# Patient Record
Sex: Female | Born: 1980
Health system: Southern US, Community
[De-identification: ages and names within clinical notes are randomized; demographics above are authoritative.]

## PROBLEM LIST (undated history)

## (undated) DIAGNOSIS — R87629 Unspecified abnormal cytological findings in specimens from vagina: Secondary | ICD-10-CM

## (undated) DIAGNOSIS — K219 Gastro-esophageal reflux disease without esophagitis: Secondary | ICD-10-CM

## (undated) DIAGNOSIS — L680 Hirsutism: Secondary | ICD-10-CM

## (undated) DIAGNOSIS — D649 Anemia, unspecified: Secondary | ICD-10-CM

## (undated) DIAGNOSIS — F419 Anxiety disorder, unspecified: Secondary | ICD-10-CM

## (undated) HISTORY — DX: Hirsutism: L68.0

## (undated) HISTORY — DX: Gastro-esophageal reflux disease without esophagitis: K21.9

## (undated) HISTORY — PX: COLPOSCOPY: SHX161

## (undated) HISTORY — DX: Unspecified abnormal cytological findings in specimens from vagina: R87.629

---

## 2006-08-08 HISTORY — PX: COLONOSCOPY: SHX174

## 2014-10-01 ENCOUNTER — Other Ambulatory Visit (INDEPENDENT_AMBULATORY_CARE_PROVIDER_SITE_OTHER): Payer: Self-pay | Admitting: Surgery

## 2014-10-01 NOTE — H&P (Signed)
Debbie Lloyd. Debbie Lloyd 10/01/2014 8:56 AM Location: [facility] Patient #: [id number] DOB: [birth year] Undefined / Language: [place] / Race: Undefined Female History of Present Illness Debbie Sportsman MD; 10/01/2014 9:34 AM) Patient words: hem.  The patient is a 34 year old female who presents with hemorrhoids. Patient comes here over concern of hemorrhoids. Pleasant active female. Avid long distance runner. Developed pain and swelling perianally concern for hemorrhoid flare years ago. Would intermittently prolapse. Saw gastroenterology last summer. No major abnormalities noted. Patient notes that it is out all the time now. Causes itching and irritation. She denies any severe pain. Has a distant history of a short period of rectal bleeding. Had normal colonoscopy in [place] at age 68. Normally has a bowel movement twice a day. Other Problems Debbie Bickers, LPN; 0/45/4098 1:19 AM) Back Pain  Past Surgical History Debbie Bickers, LPN; 1/47/8295 6:21 AM) Colon Polyp Removal - Colonoscopy Oral Surgery  Diagnostic Studies History Debbie Bickers, LPN; 10/13/6576 4:69 AM) Colonoscopy >10 years ago Pap Smear 1-5 years ago  Allergies Debbie Bickers, LPN; 02/04/5283 1:32 AM) No Known Drug Allergies 10/01/2014  Medication History Debbie Bickers, LPN; 4/40/1027 2:53 AM) Blain Pais 1/35E (21) (1-35MG -MCG Tablet, Oral) Active. Multivitamins (Oral) Active. Fish Oil Active. Fish Oil (  Capsule, Oral) Active. Iron Supplement (325 (65 Fe)MG Tablet, Oral) Active. Medications Reconciled  Social History Debbie Bickers, LPN; 6/64/4034 7:42 AM) Alcohol use Occasional alcohol use. Caffeine use Coffee, Tea. No drug use Tobacco use Never smoker.  Family History Debbie Bickers, LPN; 5/95/6387 5:64 AM) Diabetes Mellitus Father. Heart disease in female family member before age 37 Hypertension Mother.  Pregnancy / Birth History Debbie Bickers, LPN; 3/32/9518 8:41  AM) Age at menarche 11 years. Contraceptive History Oral contraceptives. Gravida 0 Regular periods     Review of Systems Debbie Lloyd [facility] LPN; 6/60/6301 6:01 AM) General Not Present- Appetite Loss, Chills, Fatigue, Fever, Night Sweats, Weight Gain and Weight Loss. Skin Not Present- Change in Wart/Mole, Dryness, Hives, Jaundice, New Lesions, Non-Healing Wounds, Rash and Ulcer. HEENT Not Present- Earache, Hearing Loss, Hoarseness, Nose Bleed, Oral Ulcers, Ringing in the Ears, Seasonal Allergies, Sinus Pain, Sore Throat, Visual Disturbances, Wears glasses/contact lenses and Yellow Eyes. Breast Not Present- Breast Mass, Breast Pain, Nipple Discharge and Skin Changes. Cardiovascular Not Present- Chest Pain, Difficulty Breathing Lying Down, Leg Cramps, Palpitations, Rapid Heart Rate, Shortness of Breath and Swelling of Extremities. Gastrointestinal Present- Hemorrhoids. Not Present- Abdominal Pain, Bloating, Bloody Stool, Change in Bowel Habits, Chronic diarrhea, Constipation, Difficulty Swallowing, Excessive gas, Gets full quickly at meals, Indigestion, Nausea, Rectal Pain and Vomiting. Female Genitourinary Not Present- Frequency, Nocturia, Painful Urination, Pelvic Pain and Urgency. Musculoskeletal Present- Back Pain. Not Present- Joint Pain, Joint Stiffness, Muscle Pain, Muscle Weakness and Swelling of Extremities. Neurological Not Present- Decreased Memory, Fainting, Headaches, Numbness, Seizures, Tingling, Tremor, Trouble walking and Weakness. Psychiatric Not Present- Anxiety, Bipolar, Change in Sleep Pattern, Depression, Fearful and Frequent crying. Endocrine Not Present- Cold Intolerance, Excessive Hunger, Hair Changes, Heat Intolerance, Hot flashes and New Diabetes. Hematology Not Present- Easy Bruising, Excessive bleeding, Gland problems, HIV and Persistent Infections.  Vitals Debbie Lloyd Dockery LPN; 0/93/2355 7:32 AM) 10/01/2014 8:59 AM Weight: 113.13 lb Height: 62in Body Surface  Area: 1.5 m Body Mass Index: 20.69 kg/m Temp.: 97.53F  Pulse: 63 (Regular)  Resp.: 14 (Unlabored)  BP: 108/68 (Sitting, Left Arm, Standard)     Physical Exam Debbie Sportsman MD; 10/01/2014 9:16 AM)  General Mental Status-Alert. General Appearance-Not in acute distress, Not Sickly. Orientation-Oriented X3. Hydration-Well  hydrated. Voice-Normal.  Integumentary Global Assessment Upon inspection and palpation of skin surfaces of the - Axillae: non-tender, no inflammation or ulceration, no drainage. and Distribution of scalp and body hair is normal. General Characteristics Temperature - normal warmth is noted.  Head and Neck Head-normocephalic, atraumatic with no lesions or palpable masses. Face Global Assessment - atraumatic, no absence of expression. Neck Global Assessment - no abnormal movements, no bruit auscultated on the right, no bruit auscultated on the left, no decreased range of motion, non-tender. Trachea-midline. Thyroid Gland Characteristics - non-tender.  Eye Eyeball - Left-Extraocular movements intact, No Nystagmus. Eyeball - Right-Extraocular movements intact, No Nystagmus. Cornea - Left-No Hazy. Cornea - Right-No Hazy. Sclera/Conjunctiva - Left-No scleral icterus, No Discharge. Sclera/Conjunctiva - Right-No scleral icterus, No Discharge. Pupil - Left-Direct reaction to light normal. Pupil - Right-Direct reaction to light normal.  ENMT Ears Pinna - Left - no drainage observed, no generalized tenderness observed. Right - no drainage observed, no generalized tenderness observed. Nose and Sinuses External Inspection of the Nose - no destructive lesion observed. Inspection of the nares - Left - quiet respiration. Right - quiet respiration. Mouth and Throat Lips - Upper Lip - no fissures observed, no pallor noted. Lower Lip - no fissures observed, no pallor noted. Nasopharynx - no discharge present. Oral Cavity/Oropharynx  - Tongue - no dryness observed. Oral Mucosa - no cyanosis observed. Hypopharynx - no evidence of airway distress observed.  Chest and Lung Exam Inspection Movements - Normal and Symmetrical. Accessory muscles - No use of accessory muscles in breathing. Palpation Palpation of the chest reveals - Non-tender. Auscultation Breath sounds - Normal and Clear.  Cardiovascular Auscultation Rhythm - Regular. Murmurs & Other Heart Sounds - Auscultation of the heart reveals - No Murmurs and No Systolic Clicks.  Abdomen Inspection Inspection of the abdomen reveals - No Visible peristalsis and No Abnormal pulsations. Umbilicus - No Bleeding, No Urine drainage. Palpation/Percussion Palpation and Percussion of the abdomen reveal - Soft, Non Tender, No Rebound tenderness, No Rigidity (guarding) and No Cutaneous hyperesthesia.  Female Genitourinary Sexual Maturity Tanner 5 - Adult hair pattern. Note: Normal external female genitalia. No no sores or lesions. No condyloma. No inflamed cysts. No vaginal bleeding nor discharge   Rectal Note: Perianal skin clean. No pilonidal disease. No pruritis. Obvious prolapsed internal hemorrhoid somewhat pedunculated. LEFT lateral pile. Normal sphincter tone. Tolerated digital and anoscopic exam. No fissure. No fistula. No proctitis. Mildly irritated RIGHT posterior and anterior internal hemorrhoid piles but not severe. Grade 1.   Peripheral Vascular Upper Extremity Inspection - Left - No Cyanotic nailbeds, Not Ischemic. Right - No Cyanotic nailbeds, Not Ischemic.  Neurologic Neurologic evaluation reveals -normal attention span and ability to concentrate, able to name objects and repeat phrases. Appropriate fund of knowledge , normal sensation and normal coordination. Mental Status Affect - not angry, not paranoid. Cranial Nerves-Normal Bilaterally. Gait-Normal.  Neuropsychiatric Mental status exam performed with findings of-able to articulate well  with normal speech/language, rate, volume and coherence, thought content normal with ability to perform basic computations and apply abstract reasoning and no evidence of hallucinations, delusions, obsessions or homicidal/suicidal ideation.  Musculoskeletal Global Assessment Spine, Ribs and Pelvis - no instability, subluxation or laxity. Right Upper Extremity - no instability, subluxation or laxity.  Lymphatic Head & Neck  General Head & Neck Lymphatics: Bilateral - Description - No Localized lymphadenopathy. Axillary  General Axillary Region: Bilateral - Description - No Localized lymphadenopathy. Femoral & Inguinal  Generalized Femoral & Inguinal Lymphatics: Left -  Description - No Localized lymphadenopathy. Right - Description - No Localized lymphadenopathy.   Results Debbie Sportsman MD; 10/01/2014 9:20 AM) Procedures  Name Value Date Hemorrhoids Procedure Anal exam: prolapse Internal exam: Internal Hemorroids ( non-bleeding) Other: Obvious LEFT lateral prolapsed hemorrhoid. Please see rest of rectal examination. Mildly irritated RIGHT sided internal piles.  Performed: 10/01/2014 9:19 AM    Assessment & Plan Debbie Sportsman MD; 10/01/2014 9:20 AM)  PROLAPSED INTERNAL HEMORRHOIDS, GRADE 4 (455.2  K64.8) Impression: Obvious LEFT lateral prolapsed hemorrhoid not reducible. I think this would benefit from removal. 2 sensitive to resect in office. We'll plan outpatient surgery. Possible hemorrhoid ligation of the piles if an issue but we tried to be overly aggressive. She is interested in surgery:  The anatomy & physiology of the anorectal region was discussed. The pathophysiology of hemorrhoids and differential diagnosis was discussed. Natural history progression was discussed. I stressed the importance of a bowel regimen to have daily soft bowel movements to minimize progression of disease. Goal of one BM / day ideal. Use of wet wipes, warm baths, avoiding straining,  etc were emphasized.  Educational handouts further explaining the pathology, treatment options, and bowel regimen were given as well. The patient expressed understanding.  Current Plans Schedule for Surgery The anatomy & physiology of the anorectal region was discussed. The pathophysiology of hemorrhoids and differential diagnosis was discussed. Natural history risks without surgery was discussed. I stressed the importance of a bowel regimen to have daily soft bowel movements to minimize progression of disease. Interventions such as sclerotherapy & banding were discussed.  The patient's symptoms are not adequately controlled by medicines and other non-operative treatments. I feel the risks & problems of no surgery outweigh the operative risks; therefore, I recommended surgery to treat the hemorrhoids by ligation, pexy, and possible resection.  Risks such as bleeding, infection, urinary difficulties, need for further treatment, heart attack, death, and other risks were discussed. I noted a good likelihood this will help address the problem. Goals of post-operative recovery were discussed as well. Possibility that this will not correct all symptoms was explained. Post-operative pain, bleeding, constipation, and other problems after surgery were discussed. We will work to minimize complications. Educational handouts further explaining the pathology, treatment options, and bowel regimen were given as well. Questions were answered. The patient expresses understanding & wishes to proceed with surgery. Written instructions provided Pt Education - [facility] Hemorrhoids (English Tomer) Pt Education - [facility] Rectal Surgery HCI (Jaqlyn Gruenhagen): discussed with patient and provided information. Pt Education - [facility] Good Bowel Health (Honi Name) Pt Education - [facility] Pelvic Floor Exercises (Kegels) and Dysfunction HCI (Demarie Uhlig) ANOSCOPY, DIAGNOSTIC (60454)  Debbie Lloyd, M.D., F.A.C.S. Gastrointestinal and Minimally Invasive [facility] [address] [place], [place] [address] [phone] Main / Paging

## 2015-02-06 HISTORY — PX: HEMORROIDECTOMY: SUR656

## 2015-03-09 ENCOUNTER — Other Ambulatory Visit: Payer: Self-pay | Admitting: Physician Assistant

## 2015-03-09 ENCOUNTER — Ambulatory Visit
Admission: RE | Admit: 2015-03-09 | Discharge: 2015-03-09 | Disposition: A | Discharge status: Home / Self Care | Payer: Self-pay | Source: Ambulatory Visit | Attending: Physician Assistant | Admitting: Physician Assistant

## 2015-03-09 DIAGNOSIS — I2699 Other pulmonary embolism without acute cor pulmonale: Secondary | ICD-10-CM

## 2015-03-09 MED ORDER — IOPAMIDOL (ISOVUE-370) INJECTION 76%
80.0000 mL | Freq: Once | INTRAVENOUS | Status: AC | PRN
  Administered 2015-03-09: 80 mL via INTRAVENOUS

## 2015-03-10 ENCOUNTER — Other Ambulatory Visit: Payer: Self-pay | Admitting: Physician Assistant

## 2015-11-11 DIAGNOSIS — Z3169 Encounter for other general counseling and advice on procreation: Secondary | ICD-10-CM | POA: Diagnosis not present

## 2015-12-25 DIAGNOSIS — M9904 Segmental and somatic dysfunction of sacral region: Secondary | ICD-10-CM | POA: Diagnosis not present

## 2015-12-25 DIAGNOSIS — M9903 Segmental and somatic dysfunction of lumbar region: Secondary | ICD-10-CM | POA: Diagnosis not present

## 2015-12-25 DIAGNOSIS — M791 Myalgia: Secondary | ICD-10-CM | POA: Diagnosis not present

## 2015-12-25 DIAGNOSIS — M9906 Segmental and somatic dysfunction of lower extremity: Secondary | ICD-10-CM | POA: Diagnosis not present

## 2016-02-02 DIAGNOSIS — L659 Nonscarring hair loss, unspecified: Secondary | ICD-10-CM | POA: Diagnosis not present

## 2016-02-02 DIAGNOSIS — D649 Anemia, unspecified: Secondary | ICD-10-CM | POA: Diagnosis not present

## 2016-02-25 DIAGNOSIS — H5213 Myopia, bilateral: Secondary | ICD-10-CM | POA: Diagnosis not present

## 2016-03-07 DIAGNOSIS — R87612 Low grade squamous intraepithelial lesion on cytologic smear of cervix (LGSIL): Secondary | ICD-10-CM | POA: Diagnosis not present

## 2016-03-07 DIAGNOSIS — Z681 Body mass index (BMI) 19 or less, adult: Secondary | ICD-10-CM | POA: Diagnosis not present

## 2016-03-11 DIAGNOSIS — F4322 Adjustment disorder with anxiety: Secondary | ICD-10-CM | POA: Diagnosis not present

## 2016-03-22 DIAGNOSIS — F4323 Adjustment disorder with mixed anxiety and depressed mood: Secondary | ICD-10-CM | POA: Diagnosis not present

## 2016-03-31 DIAGNOSIS — F4323 Adjustment disorder with mixed anxiety and depressed mood: Secondary | ICD-10-CM | POA: Diagnosis not present

## 2016-04-13 DIAGNOSIS — F4323 Adjustment disorder with mixed anxiety and depressed mood: Secondary | ICD-10-CM | POA: Diagnosis not present

## 2016-04-28 DIAGNOSIS — F4323 Adjustment disorder with mixed anxiety and depressed mood: Secondary | ICD-10-CM | POA: Diagnosis not present

## 2016-05-02 DIAGNOSIS — F411 Generalized anxiety disorder: Secondary | ICD-10-CM | POA: Diagnosis not present

## 2016-05-07 DIAGNOSIS — Z23 Encounter for immunization: Secondary | ICD-10-CM | POA: Diagnosis not present

## 2016-05-19 DIAGNOSIS — F4322 Adjustment disorder with anxiety: Secondary | ICD-10-CM | POA: Diagnosis not present

## 2016-05-19 DIAGNOSIS — F4323 Adjustment disorder with mixed anxiety and depressed mood: Secondary | ICD-10-CM | POA: Diagnosis not present

## 2016-06-02 DIAGNOSIS — F4323 Adjustment disorder with mixed anxiety and depressed mood: Secondary | ICD-10-CM | POA: Diagnosis not present

## 2016-06-23 DIAGNOSIS — F4323 Adjustment disorder with mixed anxiety and depressed mood: Secondary | ICD-10-CM | POA: Diagnosis not present

## 2016-12-02 DIAGNOSIS — Z13220 Encounter for screening for lipoid disorders: Secondary | ICD-10-CM | POA: Diagnosis not present

## 2016-12-02 DIAGNOSIS — Z0000 Encounter for general adult medical examination without abnormal findings: Secondary | ICD-10-CM | POA: Diagnosis not present

## 2016-12-02 DIAGNOSIS — Z131 Encounter for screening for diabetes mellitus: Secondary | ICD-10-CM | POA: Diagnosis not present

## 2016-12-02 DIAGNOSIS — D649 Anemia, unspecified: Secondary | ICD-10-CM | POA: Diagnosis not present

## 2016-12-14 DIAGNOSIS — F411 Generalized anxiety disorder: Secondary | ICD-10-CM | POA: Diagnosis not present

## 2016-12-21 DIAGNOSIS — F411 Generalized anxiety disorder: Secondary | ICD-10-CM | POA: Diagnosis not present

## 2017-01-10 DIAGNOSIS — Z6820 Body mass index (BMI) 20.0-20.9, adult: Secondary | ICD-10-CM | POA: Diagnosis not present

## 2017-01-10 DIAGNOSIS — Z1151 Encounter for screening for human papillomavirus (HPV): Secondary | ICD-10-CM | POA: Diagnosis not present

## 2017-01-10 DIAGNOSIS — Z01419 Encounter for gynecological examination (general) (routine) without abnormal findings: Secondary | ICD-10-CM | POA: Diagnosis not present

## 2017-01-10 DIAGNOSIS — F411 Generalized anxiety disorder: Secondary | ICD-10-CM | POA: Diagnosis not present

## 2017-01-10 DIAGNOSIS — R87610 Atypical squamous cells of undetermined significance on cytologic smear of cervix (ASC-US): Secondary | ICD-10-CM | POA: Diagnosis not present

## 2017-01-23 DIAGNOSIS — Z3169 Encounter for other general counseling and advice on procreation: Secondary | ICD-10-CM | POA: Diagnosis not present

## 2017-01-23 DIAGNOSIS — N970 Female infertility associated with anovulation: Secondary | ICD-10-CM | POA: Diagnosis not present

## 2017-02-07 DIAGNOSIS — F411 Generalized anxiety disorder: Secondary | ICD-10-CM | POA: Diagnosis not present

## 2017-02-07 DIAGNOSIS — R87611 Atypical squamous cells cannot exclude high grade squamous intraepithelial lesion on cytologic smear of cervix (ASC-H): Secondary | ICD-10-CM | POA: Diagnosis not present

## 2017-02-07 DIAGNOSIS — N870 Mild cervical dysplasia: Secondary | ICD-10-CM | POA: Diagnosis not present

## 2017-02-14 DIAGNOSIS — F411 Generalized anxiety disorder: Secondary | ICD-10-CM | POA: Diagnosis not present

## 2017-02-24 ENCOUNTER — Other Ambulatory Visit (HOSPITAL_COMMUNITY): Payer: Self-pay | Admitting: Obstetrics and Gynecology

## 2017-02-24 DIAGNOSIS — Z3169 Encounter for other general counseling and advice on procreation: Secondary | ICD-10-CM

## 2017-02-28 DIAGNOSIS — F411 Generalized anxiety disorder: Secondary | ICD-10-CM | POA: Diagnosis not present

## 2017-03-01 ENCOUNTER — Encounter (INDEPENDENT_AMBULATORY_CARE_PROVIDER_SITE_OTHER): Payer: Self-pay

## 2017-03-01 ENCOUNTER — Ambulatory Visit (HOSPITAL_COMMUNITY)
Admission: RE | Admit: 2017-03-01 | Discharge: 2017-03-01 | Disposition: A | Discharge status: Home / Self Care | Payer: BLUE CROSS/BLUE SHIELD | Source: Ambulatory Visit | Attending: Obstetrics and Gynecology | Admitting: Obstetrics and Gynecology

## 2017-03-01 DIAGNOSIS — R938 Abnormal findings on diagnostic imaging of other specified body structures: Secondary | ICD-10-CM | POA: Insufficient documentation

## 2017-03-01 DIAGNOSIS — Z3169 Encounter for other general counseling and advice on procreation: Secondary | ICD-10-CM | POA: Diagnosis not present

## 2017-03-01 DIAGNOSIS — N979 Female infertility, unspecified: Secondary | ICD-10-CM | POA: Diagnosis not present

## 2017-03-01 MED ORDER — IOPAMIDOL (ISOVUE-300) INJECTION 61%
30.0000 mL | Freq: Once | INTRAVENOUS | Status: AC | PRN
  Administered 2017-03-01: 7 mL

## 2017-03-02 DIAGNOSIS — H5213 Myopia, bilateral: Secondary | ICD-10-CM | POA: Diagnosis not present

## 2017-03-14 DIAGNOSIS — F411 Generalized anxiety disorder: Secondary | ICD-10-CM | POA: Diagnosis not present

## 2017-03-28 DIAGNOSIS — N9489 Other specified conditions associated with female genital organs and menstrual cycle: Secondary | ICD-10-CM | POA: Diagnosis not present

## 2017-03-31 ENCOUNTER — Other Ambulatory Visit: Payer: Self-pay | Admitting: Obstetrics and Gynecology

## 2017-04-03 ENCOUNTER — Encounter (HOSPITAL_COMMUNITY): Payer: Self-pay | Admitting: *Deleted

## 2017-04-17 ENCOUNTER — Ambulatory Visit (HOSPITAL_COMMUNITY)
Admission: RE | Admit: 2017-04-17 | Discharge: 2017-04-17 | Disposition: A | Discharge status: Home / Self Care | Payer: BLUE CROSS/BLUE SHIELD | Source: Ambulatory Visit | Attending: Obstetrics and Gynecology | Admitting: Obstetrics and Gynecology

## 2017-04-17 ENCOUNTER — Ambulatory Visit (HOSPITAL_COMMUNITY): Payer: BLUE CROSS/BLUE SHIELD | Admitting: Anesthesiology

## 2017-04-17 ENCOUNTER — Encounter (HOSPITAL_COMMUNITY)
Admission: RE | Disposition: A | Discharge status: Home / Self Care | Payer: Self-pay | Source: Ambulatory Visit | Attending: Obstetrics and Gynecology

## 2017-04-17 DIAGNOSIS — N938 Other specified abnormal uterine and vaginal bleeding: Secondary | ICD-10-CM | POA: Insufficient documentation

## 2017-04-17 DIAGNOSIS — N840 Polyp of corpus uteri: Secondary | ICD-10-CM | POA: Insufficient documentation

## 2017-04-17 DIAGNOSIS — F419 Anxiety disorder, unspecified: Secondary | ICD-10-CM | POA: Insufficient documentation

## 2017-04-17 DIAGNOSIS — D649 Anemia, unspecified: Secondary | ICD-10-CM | POA: Insufficient documentation

## 2017-04-17 DIAGNOSIS — N923 Ovulation bleeding: Secondary | ICD-10-CM | POA: Diagnosis not present

## 2017-04-17 HISTORY — PX: DILATATION & CURETTAGE/HYSTEROSCOPY WITH MYOSURE: SHX6511

## 2017-04-17 HISTORY — DX: Anxiety disorder, unspecified: F41.9

## 2017-04-17 HISTORY — DX: Anemia, unspecified: D64.9

## 2017-04-17 LAB — CBC
HEMATOCRIT: 38.1 % (ref 36.0–46.0)
HEMOGLOBIN: 13.2 g/dL (ref 12.0–15.0)
MCH: 28.8 pg (ref 26.0–34.0)
MCHC: 34.6 g/dL (ref 30.0–36.0)
MCV: 83.2 fL (ref 78.0–100.0)
PLATELETS: 253 10*3/uL (ref 150–400)
RBC: 4.58 MIL/uL (ref 3.87–5.11)
RDW: 13.3 % (ref 11.5–15.5)
WBC: 5.4 10*3/uL (ref 4.0–10.5)

## 2017-04-17 LAB — PREGNANCY, URINE: Preg Test, Ur: NEGATIVE

## 2017-04-17 SURGERY — DILATATION & CURETTAGE/HYSTEROSCOPY WITH MYOSURE
Anesthesia: General | Site: Vagina

## 2017-04-17 MED ORDER — MIDAZOLAM HCL 2 MG/2ML IJ SOLN
INTRAMUSCULAR | Status: DC | PRN
  Administered 2017-04-17 (×2): 1 mg via INTRAVENOUS

## 2017-04-17 MED ORDER — SCOPOLAMINE 1 MG/3DAYS TD PT72
MEDICATED_PATCH | TRANSDERMAL | Status: AC
  Administered 2017-04-17: 1.5 mg via TRANSDERMAL
  Filled 2017-04-17: qty 1

## 2017-04-17 MED ORDER — ONDANSETRON HCL 4 MG/2ML IJ SOLN
INTRAMUSCULAR | Status: AC
  Filled 2017-04-17: qty 2

## 2017-04-17 MED ORDER — DEXAMETHASONE SODIUM PHOSPHATE 10 MG/ML IJ SOLN
INTRAMUSCULAR | Status: DC | PRN
  Administered 2017-04-17: 5 mg via INTRAVENOUS

## 2017-04-17 MED ORDER — ONDANSETRON HCL 4 MG/2ML IJ SOLN
INTRAMUSCULAR | Status: DC | PRN
  Administered 2017-04-17: 4 mg via INTRAVENOUS

## 2017-04-17 MED ORDER — HYDROCODONE-ACETAMINOPHEN 5-325 MG PO TABS: 1.0000 | ORAL_TABLET | Freq: Four times a day (QID) | ORAL | 0 refills | Status: DC | PRN

## 2017-04-17 MED ORDER — PROPOFOL 10 MG/ML IV BOLUS
INTRAVENOUS | Status: AC
  Filled 2017-04-17: qty 20

## 2017-04-17 MED ORDER — LIDOCAINE HCL (CARDIAC) 20 MG/ML IV SOLN
INTRAVENOUS | Status: DC | PRN
  Administered 2017-04-17: 60 mg via INTRAVENOUS
  Administered 2017-04-17: 40 mg via INTRAVENOUS

## 2017-04-17 MED ORDER — KETOROLAC TROMETHAMINE 30 MG/ML IJ SOLN
INTRAMUSCULAR | Status: DC | PRN
  Administered 2017-04-17: 30 mg via INTRAVENOUS
  Administered 2017-04-17: 30 mg via INTRAMUSCULAR

## 2017-04-17 MED ORDER — SCOPOLAMINE 1 MG/3DAYS TD PT72
1.0000 | MEDICATED_PATCH | Freq: Once | TRANSDERMAL | Status: DC
  Administered 2017-04-17: 1.5 mg via TRANSDERMAL

## 2017-04-17 MED ORDER — FENTANYL CITRATE (PF) 100 MCG/2ML IJ SOLN
INTRAMUSCULAR | Status: DC | PRN
  Administered 2017-04-17 (×2): 50 ug via INTRAVENOUS

## 2017-04-17 MED ORDER — GLYCOPYRROLATE 0.2 MG/ML IJ SOLN
INTRAMUSCULAR | Status: DC | PRN
  Administered 2017-04-17: 0.2 mg via INTRAVENOUS

## 2017-04-17 MED ORDER — KETOROLAC TROMETHAMINE 30 MG/ML IJ SOLN
INTRAMUSCULAR | Status: AC
  Filled 2017-04-17: qty 1

## 2017-04-17 MED ORDER — DEXAMETHASONE SODIUM PHOSPHATE 10 MG/ML IJ SOLN
INTRAMUSCULAR | Status: AC
  Filled 2017-04-17: qty 1

## 2017-04-17 MED ORDER — SODIUM CHLORIDE 0.9 % IR SOLN
Status: DC | PRN
  Administered 2017-04-17: 3000 mL

## 2017-04-17 MED ORDER — MIDAZOLAM HCL 2 MG/2ML IJ SOLN
INTRAMUSCULAR | Status: AC
  Filled 2017-04-17: qty 2

## 2017-04-17 MED ORDER — LACTATED RINGERS IV SOLN
INTRAVENOUS | Status: DC
  Administered 2017-04-17 (×2): via INTRAVENOUS

## 2017-04-17 MED ORDER — LIDOCAINE HCL (CARDIAC) 20 MG/ML IV SOLN
INTRAVENOUS | Status: AC
  Filled 2017-04-17: qty 5

## 2017-04-17 MED ORDER — GLYCOPYRROLATE 0.2 MG/ML IJ SOLN
INTRAMUSCULAR | Status: AC
  Filled 2017-04-17: qty 1

## 2017-04-17 MED ORDER — PROPOFOL 10 MG/ML IV BOLUS
INTRAVENOUS | Status: DC | PRN
  Administered 2017-04-17: 50 mg via INTRAVENOUS
  Administered 2017-04-17: 150 mg via INTRAVENOUS

## 2017-04-17 MED ORDER — FENTANYL CITRATE (PF) 100 MCG/2ML IJ SOLN
INTRAMUSCULAR | Status: AC
  Filled 2017-04-17: qty 2

## 2017-04-17 SURGICAL SUPPLY — 17 items
CANISTER SUCT 3000ML PPV (MISCELLANEOUS) ×2 IMPLANT
CATH ROBINSON RED A/P 16FR (CATHETERS) ×2 IMPLANT
CLOTH BEACON ORANGE TIMEOUT ST (SAFETY) ×2 IMPLANT
CONTAINER PREFILL 10% NBF 60ML (FORM) ×2 IMPLANT
DEVICE MYOSURE LITE (MISCELLANEOUS) ×2 IMPLANT
DEVICE MYOSURE REACH (MISCELLANEOUS) IMPLANT
FILTER ARTHROSCOPY CONVERTOR (FILTER) ×2 IMPLANT
GLOVE BIOGEL PI IND STRL 7.0 (GLOVE) ×2 IMPLANT
GLOVE BIOGEL PI INDICATOR 7.0 (GLOVE) ×2
GLOVE ECLIPSE 6.5 STRL STRAW (GLOVE) ×2 IMPLANT
GOWN STRL REUS W/TWL LRG LVL3 (GOWN DISPOSABLE) ×4 IMPLANT
PACK VAGINAL MINOR WOMEN LF (CUSTOM PROCEDURE TRAY) ×2 IMPLANT
PAD OB MATERNITY 4.3X12.25 (PERSONAL CARE ITEMS) ×2 IMPLANT
SEAL ROD LENS SCOPE MYOSURE (ABLATOR) ×2 IMPLANT
TOWEL OR 17X24 6PK STRL BLUE (TOWEL DISPOSABLE) ×4 IMPLANT
TUBING AQUILEX INFLOW (TUBING) ×2 IMPLANT
TUBING AQUILEX OUTFLOW (TUBING) ×2 IMPLANT

## 2017-04-17 NOTE — Brief Op Note (Signed)
04/17/2017  11:00 AM  PATIENT:  Debbie Lloyd  36 y.o. female  PRE-OPERATIVE DIAGNOSIS:  Endometrial Mass, Intermenstrual Bleeding  POST-OPERATIVE DIAGNOSIS:  Endometrial Mass, Intermenstrual Bleeding  PROCEDURE:  Diagnostic hysteroscopy, hysteroscopic resection of endometrial polyp, Dilation and curettage  SURGEON:  Surgeon(s) and Role:    * Maxie Betterousins, Urian Martenson, MD - Primary  PHYSICIAN ASSISTANT:   ASSISTANTS: none   ANESTHESIA:   general Findings: nl tubal ostia,left posterior lateral endom polyp, nl endocervical canal EBL:  Total I/O In: -  Out: 75 [Urine:50; Blood:25]  BLOOD ADMINISTERED:none  DRAINS: none   LOCAL MEDICATIONS USED:  NONE  SPECIMEN:  Source of Specimen:  endometrial polyp with endom curetting  DISPOSITION OF SPECIMEN:  PATHOLOGY  COUNTS:  YES  TOURNIQUET:  * No tourniquets in log *  DICTATION: .Other Dictation: Dictation Number [id number]  PLAN OF CARE: Discharge to home after PACU  PATIENT DISPOSITION:  PACU - hemodynamically stable.   Delay start of Pharmacological VTE agent (>24hrs) due to surgical blood loss or risk of bleeding: no

## 2017-04-17 NOTE — Anesthesia Postprocedure Evaluation (Signed)
Anesthesia Post Note  Patient: Debbie Lloyd  Procedure(s) Performed: Procedure(s) (LRB): DILATATION & CURETTAGE/HYSTEROSCOPY WITH MYOSURE (N/A)     Patient location during evaluation: PACU Anesthesia Type: General Level of consciousness: awake and alert Pain management: pain level controlled Vital Signs Assessment: post-procedure vital signs reviewed and stable Respiratory status: spontaneous breathing, nonlabored ventilation, respiratory function stable and patient connected to nasal cannula oxygen Cardiovascular status: blood pressure returned to baseline and stable Postop Assessment: no signs of nausea or vomiting Anesthetic complications: no    Last Vitals:  Vitals:   04/17/17 1152 04/17/17 1225  BP:  125/90  Pulse: 67 62  Resp: 18 18  Temp: 36.4 C   SpO2: 100% 100%    Last Pain:  Vitals:   04/17/17 1225  TempSrc:   PainSc: 2    Pain Goal: Patients Stated Pain Goal: 4 (04/17/17 1105)               Candace Ramus EDWARD

## 2017-04-17 NOTE — Anesthesia Procedure Notes (Addendum)
Procedure Name: LMA Insertion Date/Time: 04/17/2017 10:32 AM Performed by: Cristela BlueJACKSON, KYLE Pre-anesthesia Checklist: Patient identified, Patient being monitored, Emergency Drugs available, Timeout performed and Suction available Patient Re-evaluated:Patient Re-evaluated prior to induction Oxygen Delivery Method: Circle System Utilized Preoxygenation: Pre-oxygenation with 100% oxygen Induction Type: IV induction Ventilation: Mask ventilation without difficulty LMA: LMA inserted LMA Size: 3.0 Number of attempts: 1 Placement Confirmation: positive ETCO2 and breath sounds checked- equal and bilateral

## 2017-04-17 NOTE — Discharge Instructions (Signed)

## 2017-04-17 NOTE — Transfer of Care (Signed)
Immediate Anesthesia Transfer of Care Note  Patient: Jene EveryMaria Mckey  Procedure(s) Performed: Procedure(s): DILATATION & CURETTAGE/HYSTEROSCOPY WITH MYOSURE (N/A)  Patient Location: PACU  Anesthesia Type:General  Level of Consciousness: awake, alert  and oriented  Airway & Oxygen Therapy: Patient Spontanous Breathing and Patient connected to nasal cannula oxygen  Post-op Assessment: Report given to RN, Post -op Vital signs reviewed and stable and Patient moving all extremities X 4  Post vital signs: Reviewed and stable  Last Vitals:  Vitals:   04/17/17 0941 04/17/17 1105  BP: 116/84 116/71  Pulse: 62 76  Resp: 16 12  Temp: 36.5 C (!) 36.4 C  SpO2: 100%     Last Pain:  Vitals:   04/17/17 0941  TempSrc: Oral      Patients Stated Pain Goal: 4 (04/17/17 0941)  Complications: No apparent anesthesia complications

## 2017-04-17 NOTE — H&P (Signed)
Debbie Lloyd is an 36 y.o. female G0 WF presents for surgical mgmt of large endom polyp seen on HSG and confirmed by sonohysterogram  Pertinent Gynecological History: Menses: regular Bleeding: reg Contraception: none DES exposure: denies Blood transfusions: none Sexually transmitted diseases: no past history Previous GYN Procedures: n/a  Last mammogram: n/a Last pap: abnormal: ASCUS (+) HPV Date:2018 OB History: G0   Menstrual History: Menarche age: n/a No LMP recorded.    Past Medical History:  Diagnosis Date  . Anemia   . Anxiety     Past Surgical History:  Procedure Laterality Date  . COLONOSCOPY  2008  . HEMORROIDECTOMY  02/2015    History reviewed. No pertinent family history.  Social History:  reports that she has never smoked. She has never used smokeless tobacco. She reports that she drinks about 2.4 oz of alcohol per week . She reports that she does not use drugs.  Allergies: No Known Allergies  No prescriptions prior to admission.    Review of Systems  Constitutional: Negative.     Height 5' 2.5" (1.588 m), weight 50 kg (110 lb 5 oz). Physical Exam  Constitutional: She is oriented to person, place, and time. She appears well-developed and well-nourished.  Cardiovascular: Regular rhythm.   Respiratory: Breath sounds normal.  GI: Soft.  Genitourinary: Vagina normal and uterus normal.  Neurological: She is alert and oriented to person, place, and time.  Skin: Skin is warm and dry.  Psychiatric: She has a normal mood and affect.  vulva  No lesion Adnexa nl palp  No results found for this or any previous visit (from the past 24 hour(s)).  No results found.  Assessment/Plan: IMP: endometrial mass P) dx hysteroscopy, D&C, removal of endometrial polyp. Risk of surgery includes infection, bleeding, injury to surrounding organ structures, uterine perforation( 08/998) and its risk. Thermal injury,fluid overload  And its mgmt. All ?  answered  Norberto Wishon A 04/17/2017, 9:13 AM

## 2017-04-17 NOTE — Anesthesia Preprocedure Evaluation (Signed)
Anesthesia Evaluation  Patient identified by MRN, date of birth, ID band Patient awake    Reviewed: Allergy & Precautions, H&P , Patient's Chart, lab work & pertinent test results, reviewed documented beta blocker date and time   Airway Mallampati: II  TM Distance: >3 FB Neck ROM: full    Dental no notable dental hx.    Pulmonary    Pulmonary exam normal breath sounds clear to auscultation       Cardiovascular  Rhythm:regular Rate:Normal     Neuro/Psych    GI/Hepatic   Endo/Other    Renal/GU      Musculoskeletal   Abdominal   Peds  Hematology   Anesthesia Other Findings   Reproductive/Obstetrics                             Anesthesia Physical Anesthesia Plan  ASA: II  Anesthesia Plan: General   Post-op Pain Management:    Induction: Intravenous  PONV Risk Score and Plan: 3 and Ondansetron, Dexamethasone, Midazolam and Propofol infusion  Airway Management Planned: LMA  Additional Equipment:   Intra-op Plan:   Post-operative Plan:   Informed Consent: I have reviewed the patients History and Physical, chart, labs and discussed the procedure including the risks, benefits and alternatives for the proposed anesthesia with the patient or authorized representative who has indicated his/her understanding and acceptance.   Dental Advisory Given  Plan Discussed with: CRNA and Surgeon  Anesthesia Plan Comments: ( )        Anesthesia Quick Evaluation

## 2017-04-18 ENCOUNTER — Encounter (HOSPITAL_COMMUNITY): Payer: Self-pay | Admitting: Obstetrics and Gynecology

## 2017-04-18 NOTE — Op Note (Signed)
NAME:  Debbie Lloyd, Debbie Lloyd                   ACCOUNT NO.:  MEDICAL RECORD NO.:  [id number]  LOCATION:                                 FACILITY:  PHYSICIAN:  Maxie BetterSheronette Makayleigh Poliquin, M.D.DATE OF BIRTH:  05/04/1981  DATE OF PROCEDURE:  04/17/2017 DATE OF DISCHARGE:                              OPERATIVE REPORT   PREOPERATIVE DIAGNOSIS:  Endometrial mass, IMB.  PROCEDURES: 1. Diagnostic hysteroscopy. 2. Hysteroscopic resection of endometrial polyp. 3. Dilation and curettage.  POSTOPERATIVE DIAGNOSIS:  Endometrial polyp, IMB.  ANESTHESIA:  General.  SURGEON:  Maxie BetterSheronette Chasmine Lender, M.D.  ASSISTANT:  None.  DESCRIPTION OF PROCEDURE:  Under adequate general anesthesia, the patient was placed in dorsal lithotomy position.  She was sterilely prepped and draped in usual fashion.  Bladder was catheterized for moderate amount of urine.  Examination under anesthesia revealed anteverted uterus.  No adnexal masses could be appreciated.  Bivalve speculum was placed in the vagina.  Single-tooth tenaculum was placed on the anterior lip of the cervix.  The cervix was easily dilated up to [facility] dilator.  MyoSure diagnostic hysteroscope was introduced into the uterine cavity.  Both tubal ostia could be seen well.  Arising from the right posterolateral wall is polypoid mass.  Using the Lite myosure resectoscope, the mass was removed.  The cavity was resected gently using the resectoscope.  When the mass was removed, the endocervical canal was inspected.  No lesions noted.  All instruments were then removed from the vagina.  The endometrial cavity was not individually curettaged as the patient is trying to conceive.  SPECIMEN LABELED:  Endometrial polyp with curettings sent to Pathology.  ESTIMATED BLOOD LOSS:  Minimal.  COMPLICATIONS:  None.  The patient tolerated the procedure well.  Fluid deficit was 350 ml.  The patient was transferred to the recovery room in stable  condition.     Maxie BetterSheronette Bueford Arp, M.D.     /MEDQ  D:  04/17/2017  T:  04/18/2017  Job:  [id number]

## 2017-05-17 DIAGNOSIS — N809 Endometriosis, unspecified: Secondary | ICD-10-CM | POA: Diagnosis not present

## 2017-05-17 DIAGNOSIS — Z319 Encounter for procreative management, unspecified: Secondary | ICD-10-CM | POA: Diagnosis not present

## 2017-05-17 DIAGNOSIS — Z31430 Encounter of female for testing for genetic disease carrier status for procreative management: Secondary | ICD-10-CM | POA: Diagnosis not present

## 2017-08-08 NOTE — L&D Delivery Note (Signed)
Delivery Note At 5:10 PM a viable and healthy female was delivered via Vaginal, Spontaneous (Presentation:vtx ;OA ).  APGAR: 9, 9; weight pending  .   Placenta status:spontaneous,intact not sent , .  Cord:  with the following complications:none .  Cord pH: none  Anesthesia:local   Episiotomy:  none Lacerations: Sulcus; left Vaginal;Periurethral Suture Repair: 3.0 chromic Est. Blood Loss (mL): 80  Mom to postpartum.  Baby to Couplet care / Skin to Skin.  Debbie Lloyd 05/28/2018, 6:02 PM

## 2017-08-15 DIAGNOSIS — M25552 Pain in left hip: Secondary | ICD-10-CM | POA: Diagnosis not present

## 2017-08-18 DIAGNOSIS — Z3200 Encounter for pregnancy test, result unknown: Secondary | ICD-10-CM | POA: Diagnosis not present

## 2017-08-21 DIAGNOSIS — Z3200 Encounter for pregnancy test, result unknown: Secondary | ICD-10-CM | POA: Diagnosis not present

## 2017-08-21 DIAGNOSIS — M25559 Pain in unspecified hip: Secondary | ICD-10-CM | POA: Diagnosis not present

## 2017-08-21 DIAGNOSIS — Z3201 Encounter for pregnancy test, result positive: Secondary | ICD-10-CM | POA: Diagnosis not present

## 2017-08-21 DIAGNOSIS — Z319 Encounter for procreative management, unspecified: Secondary | ICD-10-CM | POA: Diagnosis not present

## 2017-08-23 DIAGNOSIS — Z319 Encounter for procreative management, unspecified: Secondary | ICD-10-CM | POA: Diagnosis not present

## 2017-08-23 DIAGNOSIS — Z3200 Encounter for pregnancy test, result unknown: Secondary | ICD-10-CM | POA: Diagnosis not present

## 2017-08-23 DIAGNOSIS — Z3201 Encounter for pregnancy test, result positive: Secondary | ICD-10-CM | POA: Diagnosis not present

## 2017-08-30 DIAGNOSIS — F411 Generalized anxiety disorder: Secondary | ICD-10-CM | POA: Diagnosis not present

## 2017-09-11 DIAGNOSIS — F411 Generalized anxiety disorder: Secondary | ICD-10-CM | POA: Diagnosis not present

## 2017-09-18 DIAGNOSIS — Z3200 Encounter for pregnancy test, result unknown: Secondary | ICD-10-CM | POA: Diagnosis not present

## 2017-09-18 DIAGNOSIS — Z319 Encounter for procreative management, unspecified: Secondary | ICD-10-CM | POA: Diagnosis not present

## 2017-09-18 DIAGNOSIS — Z3201 Encounter for pregnancy test, result positive: Secondary | ICD-10-CM | POA: Diagnosis not present

## 2017-09-20 DIAGNOSIS — Z319 Encounter for procreative management, unspecified: Secondary | ICD-10-CM | POA: Diagnosis not present

## 2017-09-20 DIAGNOSIS — Z3201 Encounter for pregnancy test, result positive: Secondary | ICD-10-CM | POA: Diagnosis not present

## 2017-09-20 DIAGNOSIS — Z3200 Encounter for pregnancy test, result unknown: Secondary | ICD-10-CM | POA: Diagnosis not present

## 2017-10-04 DIAGNOSIS — O0900 Supervision of pregnancy with history of infertility, unspecified trimester: Secondary | ICD-10-CM | POA: Diagnosis not present

## 2017-10-09 DIAGNOSIS — F411 Generalized anxiety disorder: Secondary | ICD-10-CM | POA: Diagnosis not present

## 2017-10-18 DIAGNOSIS — O0900 Supervision of pregnancy with history of infertility, unspecified trimester: Secondary | ICD-10-CM | POA: Diagnosis not present

## 2017-10-26 DIAGNOSIS — Z3401 Encounter for supervision of normal first pregnancy, first trimester: Secondary | ICD-10-CM | POA: Diagnosis not present

## 2017-11-01 DIAGNOSIS — O0901 Supervision of pregnancy with history of infertility, first trimester: Secondary | ICD-10-CM | POA: Diagnosis not present

## 2017-11-01 DIAGNOSIS — Z3401 Encounter for supervision of normal first pregnancy, first trimester: Secondary | ICD-10-CM | POA: Diagnosis not present

## 2017-11-01 DIAGNOSIS — Z3689 Encounter for other specified antenatal screening: Secondary | ICD-10-CM | POA: Diagnosis not present

## 2017-11-01 DIAGNOSIS — Z3A10 10 weeks gestation of pregnancy: Secondary | ICD-10-CM | POA: Diagnosis not present

## 2017-11-01 DIAGNOSIS — Z36 Encounter for antenatal screening for chromosomal anomalies: Secondary | ICD-10-CM | POA: Diagnosis not present

## 2017-11-01 LAB — OB RESULTS CONSOLE GC/CHLAMYDIA
Chlamydia: NEGATIVE
Gonorrhea: NEGATIVE

## 2017-11-01 LAB — OB RESULTS CONSOLE HEPATITIS B SURFACE ANTIGEN: Hepatitis B Surface Ag: NEGATIVE

## 2017-11-01 LAB — OB RESULTS CONSOLE ABO/RH: RH TYPE: POSITIVE

## 2017-11-01 LAB — OB RESULTS CONSOLE RPR: RPR: NONREACTIVE

## 2017-11-01 LAB — OB RESULTS CONSOLE RUBELLA ANTIBODY, IGM: RUBELLA: IMMUNE

## 2017-11-01 LAB — OB RESULTS CONSOLE ANTIBODY SCREEN: ANTIBODY SCREEN: NEGATIVE

## 2017-11-01 LAB — OB RESULTS CONSOLE HIV ANTIBODY (ROUTINE TESTING): HIV: NONREACTIVE

## 2017-11-15 DIAGNOSIS — Z3A12 12 weeks gestation of pregnancy: Secondary | ICD-10-CM | POA: Diagnosis not present

## 2017-11-15 DIAGNOSIS — O0901 Supervision of pregnancy with history of infertility, first trimester: Secondary | ICD-10-CM | POA: Diagnosis not present

## 2017-11-16 DIAGNOSIS — Z118 Encounter for screening for other infectious and parasitic diseases: Secondary | ICD-10-CM | POA: Diagnosis not present

## 2017-12-04 DIAGNOSIS — F411 Generalized anxiety disorder: Secondary | ICD-10-CM | POA: Diagnosis not present

## 2017-12-12 DIAGNOSIS — O0902 Supervision of pregnancy with history of infertility, second trimester: Secondary | ICD-10-CM | POA: Diagnosis not present

## 2017-12-12 DIAGNOSIS — Z361 Encounter for antenatal screening for raised alphafetoprotein level: Secondary | ICD-10-CM | POA: Diagnosis not present

## 2017-12-12 DIAGNOSIS — Z3A15 15 weeks gestation of pregnancy: Secondary | ICD-10-CM | POA: Diagnosis not present

## 2017-12-19 DIAGNOSIS — F411 Generalized anxiety disorder: Secondary | ICD-10-CM | POA: Diagnosis not present

## 2017-12-29 DIAGNOSIS — Z3A18 18 weeks gestation of pregnancy: Secondary | ICD-10-CM | POA: Diagnosis not present

## 2017-12-29 DIAGNOSIS — O0902 Supervision of pregnancy with history of infertility, second trimester: Secondary | ICD-10-CM | POA: Diagnosis not present

## 2018-02-16 DIAGNOSIS — Z3A25 25 weeks gestation of pregnancy: Secondary | ICD-10-CM | POA: Diagnosis not present

## 2018-02-16 DIAGNOSIS — O0902 Supervision of pregnancy with history of infertility, second trimester: Secondary | ICD-10-CM | POA: Diagnosis not present

## 2018-02-16 DIAGNOSIS — Z362 Encounter for other antenatal screening follow-up: Secondary | ICD-10-CM | POA: Diagnosis not present

## 2018-03-09 DIAGNOSIS — O0902 Supervision of pregnancy with history of infertility, second trimester: Secondary | ICD-10-CM | POA: Diagnosis not present

## 2018-03-09 DIAGNOSIS — Z3689 Encounter for other specified antenatal screening: Secondary | ICD-10-CM | POA: Diagnosis not present

## 2018-03-09 DIAGNOSIS — Z3A28 28 weeks gestation of pregnancy: Secondary | ICD-10-CM | POA: Diagnosis not present

## 2018-03-23 DIAGNOSIS — Z3A30 30 weeks gestation of pregnancy: Secondary | ICD-10-CM | POA: Diagnosis not present

## 2018-03-23 DIAGNOSIS — O0903 Supervision of pregnancy with history of infertility, third trimester: Secondary | ICD-10-CM | POA: Diagnosis not present

## 2018-04-02 DIAGNOSIS — Z3A31 31 weeks gestation of pregnancy: Secondary | ICD-10-CM | POA: Diagnosis not present

## 2018-04-02 DIAGNOSIS — O0903 Supervision of pregnancy with history of infertility, third trimester: Secondary | ICD-10-CM | POA: Diagnosis not present

## 2018-04-18 DIAGNOSIS — O3663X0 Maternal care for excessive fetal growth, third trimester, not applicable or unspecified: Secondary | ICD-10-CM | POA: Diagnosis not present

## 2018-04-18 DIAGNOSIS — O0903 Supervision of pregnancy with history of infertility, third trimester: Secondary | ICD-10-CM | POA: Diagnosis not present

## 2018-04-18 DIAGNOSIS — Z3A34 34 weeks gestation of pregnancy: Secondary | ICD-10-CM | POA: Diagnosis not present

## 2018-04-30 DIAGNOSIS — Z3685 Encounter for antenatal screening for Streptococcus B: Secondary | ICD-10-CM | POA: Diagnosis not present

## 2018-04-30 DIAGNOSIS — O3663X0 Maternal care for excessive fetal growth, third trimester, not applicable or unspecified: Secondary | ICD-10-CM | POA: Diagnosis not present

## 2018-04-30 DIAGNOSIS — Z3A35 35 weeks gestation of pregnancy: Secondary | ICD-10-CM | POA: Diagnosis not present

## 2018-05-07 DIAGNOSIS — O3663X0 Maternal care for excessive fetal growth, third trimester, not applicable or unspecified: Secondary | ICD-10-CM | POA: Diagnosis not present

## 2018-05-07 DIAGNOSIS — Z3A36 36 weeks gestation of pregnancy: Secondary | ICD-10-CM | POA: Diagnosis not present

## 2018-05-07 DIAGNOSIS — Z23 Encounter for immunization: Secondary | ICD-10-CM | POA: Diagnosis not present

## 2018-05-15 DIAGNOSIS — Z3A37 37 weeks gestation of pregnancy: Secondary | ICD-10-CM | POA: Diagnosis not present

## 2018-05-15 DIAGNOSIS — O3663X0 Maternal care for excessive fetal growth, third trimester, not applicable or unspecified: Secondary | ICD-10-CM | POA: Diagnosis not present

## 2018-05-22 DIAGNOSIS — O3663X0 Maternal care for excessive fetal growth, third trimester, not applicable or unspecified: Secondary | ICD-10-CM | POA: Diagnosis not present

## 2018-05-22 DIAGNOSIS — Z3A38 38 weeks gestation of pregnancy: Secondary | ICD-10-CM | POA: Diagnosis not present

## 2018-05-23 ENCOUNTER — Telehealth (HOSPITAL_COMMUNITY): Payer: Self-pay | Admitting: *Deleted

## 2018-05-23 ENCOUNTER — Other Ambulatory Visit: Payer: Self-pay | Admitting: Obstetrics and Gynecology

## 2018-05-23 ENCOUNTER — Encounter (HOSPITAL_COMMUNITY): Payer: Self-pay | Admitting: *Deleted

## 2018-05-23 LAB — OB RESULTS CONSOLE GBS: GBS: POSITIVE

## 2018-05-23 NOTE — Telephone Encounter (Signed)
Preadmission screen  

## 2018-05-28 ENCOUNTER — Other Ambulatory Visit: Payer: Self-pay

## 2018-05-28 ENCOUNTER — Encounter (HOSPITAL_COMMUNITY): Payer: Self-pay | Admitting: Emergency Medicine

## 2018-05-28 ENCOUNTER — Inpatient Hospital Stay (HOSPITAL_COMMUNITY)
Admission: AD | Admit: 2018-05-28 | Discharge: 2018-05-30 | DRG: 807 | Disposition: A | Discharge status: Home / Self Care | Payer: BLUE CROSS/BLUE SHIELD | Attending: Obstetrics and Gynecology | Admitting: Obstetrics and Gynecology

## 2018-05-28 DIAGNOSIS — O480 Post-term pregnancy: Principal | ICD-10-CM | POA: Diagnosis present

## 2018-05-28 DIAGNOSIS — Z23 Encounter for immunization: Secondary | ICD-10-CM | POA: Diagnosis not present

## 2018-05-28 DIAGNOSIS — O99824 Streptococcus B carrier state complicating childbirth: Secondary | ICD-10-CM | POA: Diagnosis present

## 2018-05-28 DIAGNOSIS — Z3490 Encounter for supervision of normal pregnancy, unspecified, unspecified trimester: Secondary | ICD-10-CM | POA: Diagnosis present

## 2018-05-28 DIAGNOSIS — Z3A39 39 weeks gestation of pregnancy: Secondary | ICD-10-CM | POA: Diagnosis not present

## 2018-05-28 DIAGNOSIS — Q826 Congenital sacral dimple: Secondary | ICD-10-CM | POA: Diagnosis not present

## 2018-05-28 LAB — CBC
HEMATOCRIT: 34.9 % — AB (ref 36.0–46.0)
Hemoglobin: 11.6 g/dL — ABNORMAL LOW (ref 12.0–15.0)
MCH: 27 pg (ref 26.0–34.0)
MCHC: 33.2 g/dL (ref 30.0–36.0)
MCV: 81.4 fL (ref 80.0–100.0)
Platelets: 229 10*3/uL (ref 150–400)
RBC: 4.29 MIL/uL (ref 3.87–5.11)
RDW: 13.9 % (ref 11.5–15.5)
WBC: 10 10*3/uL (ref 4.0–10.5)
nRBC: 0 % (ref 0.0–0.2)

## 2018-05-28 LAB — POCT FERN TEST: POCT Fern Test: POSITIVE

## 2018-05-28 LAB — TYPE AND SCREEN
ABO/RH(D): B POS
ANTIBODY SCREEN: NEGATIVE

## 2018-05-28 LAB — ABO/RH: ABO/RH(D): B POS

## 2018-05-28 MED ORDER — DIBUCAINE 1 % RE OINT: 1.0000 "application " | TOPICAL_OINTMENT | RECTAL | Status: DC | PRN

## 2018-05-28 MED ORDER — SENNOSIDES-DOCUSATE SODIUM 8.6-50 MG PO TABS
2.0000 | ORAL_TABLET | ORAL | Status: DC
  Filled 2018-05-28 (×2): qty 2

## 2018-05-28 MED ORDER — ONDANSETRON HCL 4 MG PO TABS: 4.0000 mg | ORAL_TABLET | ORAL | Status: DC | PRN

## 2018-05-28 MED ORDER — LACTATED RINGERS IV SOLN
INTRAVENOUS | Status: DC
  Administered 2018-05-28: 06:00:00 via INTRAVENOUS

## 2018-05-28 MED ORDER — MISOPROSTOL 50MCG HALF TABLET: 50.0000 ug | ORAL_TABLET | ORAL | Status: DC

## 2018-05-28 MED ORDER — FENTANYL CITRATE (PF) 100 MCG/2ML IJ SOLN
100.0000 ug | Freq: Once | INTRAMUSCULAR | Status: AC
  Administered 2018-05-28: 100 ug via INTRAVENOUS
  Filled 2018-05-28: qty 2

## 2018-05-28 MED ORDER — PRENATAL MULTIVITAMIN CH
1.0000 | ORAL_TABLET | Freq: Every day | ORAL | Status: DC
  Administered 2018-05-29 – 2018-05-30 (×2): 1 via ORAL
  Filled 2018-05-28 (×2): qty 1

## 2018-05-28 MED ORDER — FERROUS SULFATE 325 (65 FE) MG PO TABS
325.0000 mg | ORAL_TABLET | Freq: Two times a day (BID) | ORAL | Status: DC
  Administered 2018-05-29 – 2018-05-30 (×2): 325 mg via ORAL
  Filled 2018-05-28 (×3): qty 1

## 2018-05-28 MED ORDER — SODIUM CHLORIDE 0.9 % IV SOLN
5.0000 10*6.[IU] | Freq: Once | INTRAVENOUS | Status: AC
  Administered 2018-05-28: 5 10*6.[IU] via INTRAVENOUS
  Filled 2018-05-28: qty 5

## 2018-05-28 MED ORDER — SOD CITRATE-CITRIC ACID 500-334 MG/5ML PO SOLN: 30.0000 mL | ORAL | Status: DC | PRN

## 2018-05-28 MED ORDER — DIPHENHYDRAMINE HCL 25 MG PO CAPS: 25.0000 mg | ORAL_CAPSULE | Freq: Four times a day (QID) | ORAL | Status: DC | PRN

## 2018-05-28 MED ORDER — LACTATED RINGERS IV SOLN: 500.0000 mL | INTRAVENOUS | Status: DC | PRN

## 2018-05-28 MED ORDER — COCONUT OIL OIL
1.0000 "application " | TOPICAL_OIL | Status: DC | PRN
  Administered 2018-05-28: 1 via TOPICAL
  Filled 2018-05-28: qty 120

## 2018-05-28 MED ORDER — BENZOCAINE-MENTHOL 20-0.5 % EX AERO
1.0000 "application " | INHALATION_SPRAY | CUTANEOUS | Status: DC | PRN
  Administered 2018-05-28: 1 via TOPICAL
  Filled 2018-05-28: qty 56

## 2018-05-28 MED ORDER — ONDANSETRON HCL 4 MG/2ML IJ SOLN: 4.0000 mg | INTRAMUSCULAR | Status: DC | PRN

## 2018-05-28 MED ORDER — OXYTOCIN BOLUS FROM INFUSION
500.0000 mL | Freq: Once | INTRAVENOUS | Status: AC
  Administered 2018-05-28: 500 mL via INTRAVENOUS

## 2018-05-28 MED ORDER — OXYTOCIN 40 UNITS IN LACTATED RINGERS INFUSION - SIMPLE MED
1.0000 m[IU]/min | INTRAVENOUS | Status: DC
  Administered 2018-05-28: 2 m[IU]/min via INTRAVENOUS

## 2018-05-28 MED ORDER — LIDOCAINE HCL (PF) 1 % IJ SOLN
30.0000 mL | INTRAMUSCULAR | Status: DC | PRN
  Administered 2018-05-28: 30 mL via SUBCUTANEOUS
  Filled 2018-05-28: qty 30

## 2018-05-28 MED ORDER — OXYTOCIN 40 UNITS IN LACTATED RINGERS INFUSION - SIMPLE MED
2.5000 [IU]/h | INTRAVENOUS | Status: DC
  Filled 2018-05-28: qty 1000

## 2018-05-28 MED ORDER — ONDANSETRON HCL 4 MG/2ML IJ SOLN: 4.0000 mg | Freq: Four times a day (QID) | INTRAMUSCULAR | Status: DC | PRN

## 2018-05-28 MED ORDER — TERBUTALINE SULFATE 1 MG/ML IJ SOLN
0.2500 mg | Freq: Once | INTRAMUSCULAR | Status: DC | PRN
  Filled 2018-05-28: qty 1

## 2018-05-28 MED ORDER — ACETAMINOPHEN 325 MG PO TABS: 650.0000 mg | ORAL_TABLET | ORAL | Status: DC | PRN

## 2018-05-28 MED ORDER — IBUPROFEN 600 MG PO TABS
600.0000 mg | ORAL_TABLET | Freq: Four times a day (QID) | ORAL | Status: DC
  Administered 2018-05-28 – 2018-05-30 (×8): 600 mg via ORAL
  Filled 2018-05-28 (×8): qty 1

## 2018-05-28 MED ORDER — WITCH HAZEL-GLYCERIN EX PADS: 1.0000 "application " | MEDICATED_PAD | CUTANEOUS | Status: DC | PRN

## 2018-05-28 MED ORDER — ZOLPIDEM TARTRATE 5 MG PO TABS: 5.0000 mg | ORAL_TABLET | Freq: Every evening | ORAL | Status: DC | PRN

## 2018-05-28 MED ORDER — SIMETHICONE 80 MG PO CHEW: 80.0000 mg | CHEWABLE_TABLET | ORAL | Status: DC | PRN

## 2018-05-28 MED ORDER — ACETAMINOPHEN 325 MG PO TABS
650.0000 mg | ORAL_TABLET | ORAL | Status: DC | PRN
  Administered 2018-05-28: 650 mg via ORAL
  Filled 2018-05-28: qty 2

## 2018-05-28 MED ORDER — PENICILLIN G 3 MILLION UNITS IVPB - SIMPLE MED
3.0000 10*6.[IU] | INTRAVENOUS | Status: DC
  Administered 2018-05-28 (×2): 3 10*6.[IU] via INTRAVENOUS
  Filled 2018-05-28 (×6): qty 100

## 2018-05-28 NOTE — Anesthesia Pain Management Evaluation Note (Signed)
  [facility] Pain Management Visit Note  Patient: Debbie Lloyd, 37 y.o., female  "Hello I am a member of the anesthesia team at [facility]. We have an anesthesia team available at all times to provide care throughout the hospital, including epidural management and anesthesia for C-section. I don't know your plan for the delivery whether it a natural birth, water birth, IV sedation, nitrous supplementation, doula or epidural, but we want to meet your pain goals."   1.Was your pain managed to your expectations on prior hospitalizations?   No prior hospitalizations  2.What is your expectation for pain management during this hospitalization?     Labor support without medications  3.How can we help you reach that goal? Pt desires natural labor but was open to discussion about pain control options. Questions answered.  Record the patient's initial score and the patient's pain goal.   Pain: 6  Pain Goal: 10 [facility] wants you to be able to say your pain was always managed very well.  Debbie Lloyd 05/28/2018

## 2018-05-28 NOTE — Progress Notes (Signed)
S: breathing with ctx S/p IV fentanyl  O: BP 128/80   Pulse 70   Temp (!) 96.8 F (36 C) (Axillary)   Resp (!) 22   Ht 5' 2.5" (1.588 m)   Wt 65.8 kg   SpO2 100%   BMI 26.11 kg/m  VE per RN 9/100/+1  Tracing: baseline 145 (+) accel Ctx q 2-3 mins  IMP: active phase GBS cx (+)  P) cont present mgmt

## 2018-05-28 NOTE — MAU Note (Signed)
Pt SROM clear around 0230. Irregular cntx for past 2 hours. Denies bleeding. Reduced fetal movement.

## 2018-05-28 NOTE — Progress Notes (Signed)
Debbie Lloyd is a 37 y.o. G2P0010 at 104w4d by LMP admitted for rupture of membranes  Subjective: Chief Complaint  Patient presents with  . Rupture of Membranes  doing natural labor.   Objective: BP 118/70   Pulse 65   Temp (!) 96.8 F (36 C) (Axillary)   Resp 18   Ht 5' 2.5" (1.588 m)   Wt 65.8 kg   SpO2 100%   BMI 26.11 kg/m  No intake/output data recorded. No intake/output data recorded.  FHT:  FHR: 145 bpm, variability: moderate,  accelerations:  Present,  decelerations:  Absent UC:   regular, every 2-3 minutes SVE:   3-4 cm dilated, 90% effaced, 0station Tracing: cat 1  Labs: Lab Results  Component Value Date   WBC 10.0 05/28/2018   HGB 11.6 (L) 05/28/2018   HCT 34.9 (L) 05/28/2018   MCV 81.4 05/28/2018   PLT 229 05/28/2018    Assessment / Plan: Spontaneous labor, progressing normally GBS cx (+)  P) cont with pitocin.   Anticipated MOD:  NSVD  Miamarie Moll A Rokia Bosket 05/28/2018, 12:16 PM

## 2018-05-28 NOTE — MAU Note (Signed)
[name] is a [redacted]w[redacted]d G2P0010 at [redacted]w[redacted]d who presents to MAU today with complaint of contractions 3 minutes apart since 3 AM. She denies vaginal bleeding. She endorses LOF since 0230. Her GBS status is positive. She does not desire epidural.   BP 129/70 (BP Location: Right Arm)   Pulse (!) 59   Temp 97.7 F (36.5 C) (Oral)   Resp 16   Ht 5' 2.5" (1.588 m)   Wt 65.8 kg   SpO2 100%   BMI 26.11 kg/m  Dilation: 1 Effacement (%): 80 Cervical Position: Middle Station: -1 Presentation: Vertex Exam by:: Everlene Farrier, RN   Report called to on-call MD for admission Verbal orders taken Patient transported to L&D

## 2018-05-28 NOTE — H&P (Signed)
Debbie Lloyd is a 37 y.o. female presenting @ 39 4/7 weeks with SROM clear fluid @ 2:30 pm (+) irreg ctx. (+) GBS OB History    Gravida  2   Para      Term      Preterm      AB  1   Living        SAB  1   TAB      Ectopic      Multiple      Live Births             Past Medical History:  Diagnosis Date  . Anemia   . Anxiety   . GERD (gastroesophageal reflux disease)   . Idiopathic hirsutism   . Vaginal Pap smear, abnormal    Past Surgical History:  Procedure Laterality Date  . COLONOSCOPY  2008  . COLPOSCOPY    . DILATATION & CURETTAGE/HYSTEROSCOPY WITH MYOSURE N/A 04/17/2017   Procedure: DILATATION & CURETTAGE/HYSTEROSCOPY WITH MYOSURE;  Surgeon: Maxie Better, MD;  Location: [facility];  Service: Gynecology;  Laterality: N/A;  . HEMORROIDECTOMY  02/2015   Family History: family history includes Diabetes in her father and maternal grandmother; Hypertension in her maternal grandmother; Stroke in her maternal grandfather. Social History:  reports that she has never smoked. She has never used smokeless tobacco. She reports that she drank about 4.0 standard drinks of alcohol per week. She reports that she does not use drugs.     Maternal Diabetes: No Genetic Screening: Normal Maternal Ultrasounds/Referrals: Abnormal:  Findings:   Isolated EIF (echogenic intracardiac focus) Fetal Ultrasounds or other Referrals:  None Maternal Substance Abuse:  No Significant Maternal Medications:  None Significant Maternal Lab Results:  Lab values include: Group B Strep positive Other Comments:  None  ROS Maternal Medical History:  Reason for admission: Rupture of membranes and contractions.   Contractions: Frequency: irregular.    Fetal activity: Perceived fetal activity is normal.    Prenatal complications: no prenatal complications Prenatal Complications - Diabetes: none.    Dilation: 1 Effacement (%): 70 Station: -1 Exam by:: S. Oklesh RN Blood pressure  131/82, pulse 74, temperature 98.1 F (36.7 C), temperature source Oral, resp. rate 18, height 5' 2.5" (1.588 m), weight 65.8 kg, SpO2 100 %. Maternal Exam:  Uterine Assessment: Contraction strength is moderate.  Abdomen: Patient reports no abdominal tenderness. Fetal presentation: vertex  Introitus: Ferning test: positive.  Amniotic fluid character: clear.  Pelvis: adequate for delivery.      Physical Exam  Vitals reviewed. Constitutional: She is oriented to person, place, and time. She appears well-developed and well-nourished.  Eyes: EOM are normal.  Neck: Neck supple.  Cardiovascular: Normal rate.  Respiratory: Breath sounds normal.  GI: Soft.  Neurological: She is alert and oriented to person, place, and time.  Skin: Skin is dry.    Prenatal labs: ABO, Rh: --/--/B POS (10/21 1610) Antibody: NEG (10/21 0615) Rubella: Immune (03/27 0000) RPR: Nonreactive (03/27 0000)  HBsAg: Negative (03/27 0000)  HIV: Non-reactive (03/27 0000)  GBS: Positive (10/16 0000)   Assessment/Plan: SROM (+) GBS  Post term Early labor P) admit. Routine labs. IV PCN. Pitocin augmentation  Nashly Olsson A Tomisha Reppucci 05/28/2018, 9:07 AM

## 2018-05-29 LAB — CBC
HCT: 33 % — ABNORMAL LOW (ref 36.0–46.0)
Hemoglobin: 11.1 g/dL — ABNORMAL LOW (ref 12.0–15.0)
MCH: 27.2 pg (ref 26.0–34.0)
MCHC: 33.6 g/dL (ref 30.0–36.0)
MCV: 80.9 fL (ref 80.0–100.0)
Platelets: 221 10*3/uL (ref 150–400)
RBC: 4.08 MIL/uL (ref 3.87–5.11)
RDW: 14.3 % (ref 11.5–15.5)
WBC: 14.3 10*3/uL — ABNORMAL HIGH (ref 4.0–10.5)

## 2018-05-29 LAB — RPR: RPR: NONREACTIVE

## 2018-05-29 NOTE — Progress Notes (Signed)
MOB was referred for history of depression/anxiety. * Referral screened out by Clinical Social Worker because none of the following criteria appear to apply: ~ History of anxiety/depression during this pregnancy, or of post-partum depression following prior delivery. ~ Diagnosis of anxiety and/or depression within last 3 years OR * MOB's symptoms currently being treated with medication and/or therapy. Please contact the Clinical Social Worker if needs arise, by MOB request, or if MOB scores greater than 9/yes to question 10 on Edinburgh Postpartum Depression Screen.  Rebekkah Powless, LCSW Clinical Social Worker  [facility]  [phone]  

## 2018-05-29 NOTE — Progress Notes (Signed)
Patient requests to not be bothered until 2030, so her and her husband can get some sleep. Infant just had a feeding and is resting in her crib. Gave patient her scheduled motrin. Informed her I was leaving at 1930 and she would be getting a new Charity fundraiser. Informed patient to call for RN if she needed anything.

## 2018-05-29 NOTE — Progress Notes (Signed)
PPD #1, SVD, sulcus, left vaginal, and periurethral repair, baby girl "Sofia"  S:  Reports feeling good, minimal discomforts, no complaints              Tolerating po/ No nausea or vomiting / Denies dizziness or SOB             Bleeding is light             Pain controlled with Motrin             Up ad lib / ambulatory / voiding QS  Newborn breast feeding - reports it has been painful and a learning experience; would like to see lactation today  O:               VS: BP 125/75   Pulse (!) 50   Temp 98.3 F (36.8 C) (Oral)   Resp 16   Ht 5' 2.5" (1.588 m)   Wt 65.8 kg   SpO2 100%   Breastfeeding? Unknown   BMI 26.11 kg/m    LABS:              Recent Labs    05/28/18 0615 05/29/18 0511  WBC 10.0 14.3*  HGB 11.6* 11.1*  PLT 229 221               Blood type: --/--/B POS, B POS Performed at [facility], [address] [place], [place] [address]  [phone])  Rubella: Immune (03/27 0000)                     I&O: Intake/Output      10/21 0701 - 10/22 0700 10/22 0701 - 10/23 0700   Blood 80    Total Output 80    Net -80         Flu vaccine and Tdap UTD              Physical Exam:             Alert and oriented X3  Lungs: Clear and unlabored  Heart: regular rate and rhythm / no murmurs  Abdomen: soft, non-tender, non-distended              Fundus: firm, non-tender, U-1  Perineum: well approximated sulcus, vaginal and periurethral repair, no edema, no ecchymosis, no erythema   Lochia: moderate, no clots   Extremities: no edema, no calf pain or tenderness    A: PPD # 1, SVD  Sulcus, left vaginal, periurethral repair   Doing well - stable status  P: Routine post partum orders  See lactation today  Encouraged to rest when baby rests  May shower today  Anticipate discharge home tomorrow  Carlean Jews, MSN, CNM [facility]

## 2018-05-29 NOTE — Lactation Note (Signed)
This note was copied from a baby's chart. Lactation Consultation Note  Patient Name: Debbie Lloyd [id number] Date: 05/29/2018 Reason for consult: Initial assessment;Primapara;1st time breastfeeding;Term;Infant weight loss  24 hours old FT female who is being exclusively BF by her mother, she's a P1. Mom attended BF classes here at [facility] and she already knows how to hand express. Able to get two big drops of colostrum when [facility] assisted with hand expression. Mom has a Medela DEBP and a hand pump at home.  Mom's main complain was the soreness she's feeling at the breast right now, LC checked and breast status is not compromised, both nipples looking intact upon examination, mom is probably experiencing transient soreness. She request comfort gels, instructions, cleaning and storage were reviewed.  Both parents were very engaged in [facility] consultation, they had lots of questions. Discussed transient soreness, BF positioning, hand expression, cluster feeding, prevention and treatment of sore nipples. LC offered assistance with latch and parents agreed to wake baby up to feed. LC changed baby's diaper and after she was wide awake, LC took her STS to mom's breast in cross cradle position and she was able to latch only after a few tries; and able to sustain the latch throughout most of the feeding. Baby still nursing when exiting the room.  Feeding plan  1. Encourage mom to feed baby STS 8-12 times/24 hours or sooner if feeding cues are present. 2. Hand expression and spoon feeding was also encouraged  Reviewed BF brochure, BF resources and feeding diary. Parents reported all questions and concerns were answered, they're both aware of LC services and will call PRN.   Maternal Data Formula Feeding for Exclusion: No Has patient been taught Hand Expression?: Yes Does the patient have breastfeeding experience prior to this delivery?: No  Feeding Feeding Type: Breast Fed  LATCH Score Latch: Repeated  attempts needed to sustain latch, nipple held in mouth throughout feeding, stimulation needed to elicit sucking reflex.  Audible Swallowing: A few with stimulation  Type of Nipple: Everted at rest and after stimulation  Comfort (Breast/Nipple): Soft / non-tender(transient soreness only)  Hold (Positioning): Assistance needed to correctly position infant at breast and maintain latch.  LATCH Score: 7  Interventions Interventions: Breast feeding basics reviewed;Assisted with latch;Skin to skin;Breast massage;Hand express;Breast compression;Adjust position;Comfort gels;Support pillows  Lactation Tools Discussed/Used Tools: Comfort gels(mother's request) WIC Program: No   Consult Status Consult Status: Follow-up Date: 05/30/18 Follow-up type: In-patient    Sharnika Binney Venetia Constable 05/29/2018, 5:59 PM

## 2018-05-30 MED ORDER — IBUPROFEN 600 MG PO TABS: 600.0000 mg | ORAL_TABLET | Freq: Four times a day (QID) | ORAL | 0 refills | Status: DC

## 2018-05-30 NOTE — Progress Notes (Signed)
PPD #2, 1, SVD, sulcus, left vaginal, and periurethral repair, baby girl "Sofia"   S:  Reports feeling well and ready to be discharged home             Tolerating po/ No nausea or vomiting / Denies dizziness or SOB             Bleeding is light             Pain controlled with Motrin             Up ad lib / ambulatory / voiding QS  Newborn breast feeding - going well; having some nipple soreness  O:               VS: BP 128/71   Pulse (!) 58   Temp 98 F (36.7 C) (Oral)   Resp 16   Ht 5' 2.5" (1.588 m)   Wt 65.8 kg   SpO2 100%   Breastfeeding? Unknown   BMI 26.11 kg/m    LABS:              Recent Labs    05/28/18 0615 05/29/18 0511  WBC 10.0 14.3*  HGB 11.6* 11.1*  PLT 229 221               Blood type: --/--/B POS, B POS Performed at [facility], [address], [place], [place] [address]  [phone])  Rubella: Immune (03/27 0000)                    Flu and Tdap UTD              Physical Exam:             Alert and oriented X3  Lungs: Clear and unlabored  Heart: regular rate and rhythm / no murmurs  Abdomen: soft, non-tender, non-distended              Fundus: firm, non-tender, U-2  Perineum: well approximated, no edema, no erythema, no ecchymosis   Lochia: small, no clots   Extremities: trace pedal edema, no calf pain or tenderness    A: PPD # 2, SVD             Sulcus, left vaginal, periurethral repair              Doing well - stable status  P: Routine post partum orders  Discharge home  WOB discharge book given, instructions and warning s/s reviewed   F/u with Dr. Cherly Hensen in 6 weeks   Carlean Jews, MSN, CNM [facility]

## 2018-05-30 NOTE — Discharge Summary (Signed)
Obstetric Discharge Summary   Patient Name: Debbie Lloyd DOB: 1981/02/24 MRN: [id number]  Date of Admission: 05/28/2018 Date of Discharge: 05/30/2018 Date of Delivery: 05/28/18 Gestational Age at Delivery: [redacted]w[redacted]d  Primary OB: [facility] - Dr. Cherly Hensen  Antepartum complications:  - GBS Positive  - Hx. Of infertility; IUI conception  - Isolated left echogenic foci (cardiac) Prenatal Labs:  ABO, Rh: --/--/B POS (10/21 4098) Antibody: NEG (10/21 0615) Rubella: Immune (03/27 0000) RPR: Nonreactive (03/27 0000)  HBsAg: Negative (03/27 0000)  HIV: Non-reactive (03/27 0000)  GBS: Positive (10/16 0000)  Admitting Diagnosis: Active labor at term   Secondary Diagnoses: Patient Active Problem List   Diagnosis Date Noted  . SVD (spontaneous vaginal delivery) 05/29/2018  . Obstetrical laceration: sulcus, left vaginal, periurethral 05/29/2018  . Encounter for planned induction of labor 05/28/2018  . Postpartum care following vaginal delivery (10/21) 05/28/2018    Augmentation: Pitocin Complications: None  Date of Delivery: 05/28/18 Delivered By: Dr. Cherly Hensen Delivery Type: spontaneous vaginal delivery Anesthesia: local Placenta: sponatneous Laceration: Sulcus; left Vaginal;Periurethral Episiotomy: none  Newborn Data: Live born female  Birth Weight: 6 lb 7.7 oz (2940 g) APGAR: 9, 9  Newborn Delivery   Birth date/time:  05/28/2018 17:10:00 Delivery type:  Vaginal, Spontaneous        Hospital/Postpartum Course  (Vaginal Delivery): Pt. Admitted for labor with SROM at 39+4 weeks. See notes for details. Patient had an uncomplicated postpartum course.  By time of discharge on PPD#2, her pain was controlled on oral pain medications; she had appropriate lochia and was ambulating, voiding without difficulty and tolerating regular diet.  She was deemed stable for discharge to home.     Labs: CBC Latest Ref Rng & Units 05/29/2018 05/28/2018 04/17/2017  WBC 4.0 - 10.5 K/uL  14.3(H) 10.0 5.4  Hemoglobin 12.0 - 15.0 g/dL 11.1(L) 11.6(L) 13.2  Hematocrit 36.0 - 46.0 % 33.0(L) 34.9(L) 38.1  Platelets 150 - 400 K/uL 221 229 253   Conflict (See Lab Report): B POS/B POS Performed at [facility], [address], [place], [place] [address]   Physical exam:  BP 128/71   Pulse (!) 58   Temp 98 F (36.7 C) (Oral)   Resp 16   Ht 5' 2.5" (1.588 m)   Wt 65.8 kg   SpO2 100%   Breastfeeding? Unknown   BMI 26.11 kg/m  General: alert and no distress Pulm: normal respiratory effort Lochia: appropriate Abdomen: soft, NT Uterine Fundus: firm, below umbilicus Perineum: healing well, no significant erythema, no significant edema Incision: c/d/i, healing well, no significant drainage, no dehiscence, no significant erythema Extremities: No evidence of DVT seen on physical exam. No lower extremity edema.   Disposition: stable, discharge to home Baby Feeding: breast milk Baby Disposition: home with mom  Contraception: not discussed  Rh Immune globulin given: N/A Rubella vaccine given: N/A Tdap vaccine given in AP or PP setting: UTD Flu vaccine given in AP or PP setting: UTD   Plan:  Dayanna Pryce was discharged to home in good condition. Follow-up appointment at [facility] in 6 weeks.  Discharge Instructions: Per After Visit Summary. Refer to After Visit Summary and [facility] discharge booklet  Activity: Advance as tolerated. Pelvic rest for 6 weeks.   Diet: Regular, Heart Healthy Discharge Medications: Allergies as of 05/30/2018   No Known Allergies     Medication List    STOP taking these medications   magnesium 30 MG tablet     TAKE these medications   folic acid  1 MG tablet Commonly known as:  FOLVITE Take 1 mg by mouth daily.   ibuprofen 600 MG tablet Commonly known as:  ADVIL,MOTRIN Take 1 tablet (600 mg total) by mouth every 6 (six) hours.   multivitamin-prenatal 27-0.8 MG Tabs tablet Take 1 tablet by mouth every  evening.   pantoprazole 40 MG tablet Commonly known as:  PROTONIX Take 40 mg by mouth daily.   PSYLLIUM PO Take 5 mLs by mouth at bedtime. "One spoonful"      Outpatient follow up:  Follow-up Information    Maxie Better, MD. Schedule an appointment as soon as possible for a visit in 6 week(s).   Specialty:  Obstetrics and Gynecology Why:  Postpartum visit  Contact information: [address] [place] [address] [phone]           Signed:  Carlean Jews, MSN, CNM [facility]

## 2018-06-01 DIAGNOSIS — O9213 Cracked nipple associated with lactation: Secondary | ICD-10-CM | POA: Diagnosis not present

## 2018-06-01 DIAGNOSIS — Q381 Ankyloglossia: Secondary | ICD-10-CM | POA: Diagnosis not present

## 2018-06-06 ENCOUNTER — Inpatient Hospital Stay (HOSPITAL_COMMUNITY): Admission: RE | Admit: 2018-06-06 | Payer: BLUE CROSS/BLUE SHIELD | Source: Ambulatory Visit

## 2018-06-07 DIAGNOSIS — O9213 Cracked nipple associated with lactation: Secondary | ICD-10-CM | POA: Diagnosis not present

## 2018-06-26 DIAGNOSIS — B3789 Other sites of candidiasis: Secondary | ICD-10-CM | POA: Diagnosis not present

## 2018-07-10 ENCOUNTER — Ambulatory Visit: Payer: Self-pay

## 2018-07-10 NOTE — Lactation Note (Signed)
This note was copied from a baby's chart. 07/10/2018  Name: Debbie Lloyd MRN: [id number] Date of Birth: 05/28/2017 Gestational Age: Gestational Age: 4735w4d Birth Weight: 103.7 oz Weight today:    9 pounds 3.6 ounces (4240 grams) with clean size 451 diaper  326 week old Infant presents today with mom for feeding assessment due to sore nipples. Infant post tongue and lip release yesterday by Dr. Lexine BatonHisaw.   Infant has gained 1448 grams in the last 34 days with an average daily weight gain of 43 grams a day. Infant is gaining weight well.   Mom reports she has had sore nipples since infant born. She suspected yeast and then had infant tongue and lip tie released. Mom reports she often has to pump for 1-2 days to rest the nipples due to pain. Mom reports her right nipple is usually compressed post feeding. Left nipple is less compressed post feeding.   Mom feeds infant on demand on both breasts with each feeding. Mom reports there are days that she has to pump and bottle feed as the pain is too great. Nipple tissue intact.   Infant fed well in the office. Mom with no issues with this feeding and reports she feels BF is improving some. Discussed with mom it may take 3-4 weeks to see the benefit from the tongue and lip release. Showed mom suck training exercises to begin tomorrow. Enc mom to do suck training prior to latch to get infant into a rhythmic suckle before latch. Enc mom to work on waiting for wide open mouth prior to latch to the breast and the bottle.   Mom reports infant tolerates bottle feeding well.   Infant to follow up with Dr. Lexine BatonHisaw next week. Infant to follow up with Ped at 2 months. Infant to follow up with LC as needed. Mom has been attending BF Support Groups.   Mom to call back with further questions/concerns as needed.     General Information: Mother's reason for visit: Feeding assessment, nipple pain, Tongue/Lip releases 12/2 by Dr. Lexine BatonHisaw Consult:  Initial Lactation consultant: Noralee StainSharon Peggi Yono RN,IBCLC Breastfeeding experience: nipple pain with feeding, pumping and bottle feeding some Maternal medical conditions: Infertility Maternal medications: Pre-natal vitamin  Breastfeeding History: Frequency of breast feeding: 7-8 x a day Duration of feeding: 15 minutes on each side  Supplementation: Supplement method: bottle(Tommie Tippee or Medela )         Breast milk volume: 3 ounces Breast milk frequency: 7-8 x a day when not BF   Pump type: Other(Medela Lactina) Pump frequency: 0-8 x a day depending on if infant latching Pump volume: 1-2 ounces  Infant Output Assessment: Voids per 24 hours: 10 Urine color: Clear yellow Stools per 24 hours: 5 Stool color: Yellow  Breast Assessment: Breast: Soft, Compressible Nipple: Erect Pain level: 7 Pain interventions: Bra  Feeding Assessment: Infant oral assessment: Variance Infant oral assessment comment: Infant with granulation tissue to upper lip. Upper lip tight with flanging and on the breast, mom has to flange upper lip at times on the breast. Infant clicks throughout feeding on the breast. Infant with tongue thrusting on the finger and on the breast, mom reports infant bites and chomps on the breast. Infant with small area of granulation tissue to the underside of the tongue. Infant with good tongue extension and lateralization. Infant with some limited mid tongue elevation noted. Infant with slight hump to back of tongue with suckling on gloved finger. mom's left nipple was rounded post  feeding, left nipple was slightly compressed post feeding. Parents are performing stretches as prescribed.  it was the right nipple that was slightly compressed post feeding.  Positioning: Cross cradle(left breast x 15 minutes) Latch: 2 - Grasps breast easily, tongue down, lips flanged, rhythmical sucking. Audible swallowing: 2 - Spontaneous and intermittent Type of nipple: 2 - Everted at rest and  after stimulation Comfort: 1 - Filling, red/small blisters or bruises, mild/mod discomfort Hold: 2 - No assistance needed to correctly position infant at breast LATCH score: 9 Latch assessment: Deep Lips flanged: No(upper lip needed flanging) Suck assessment: Displays both   Pre-feed weight: 4184 grams Post feed weight: 4240 grams Amount transferred: 56 ml, infant had fed prior to coming for visit Amount supplemented: 0  Additional Feeding Assessment:                                    Totals: Total amount transferred: 56 ml Total supplement given: 0 Total amount pumped post feed: did not pump   Plan:  1. Feed infant at the breast on demand 2. Make sure upper and lower lips are flanged after latch 3. Pump to rest nipples as needed for pain 4. Infant needs about 77-103 ml (2.5-3.5 ounces) for 8 feeds a day or 615-820 ml (21-27 ounces) in 24 hours. Infant may eat more or less depending on how often she feeds. Feed her until she is satisfies.  5. Continue stretches as per Dr. Lexine Baton 6. Suck training exercises 5-6 x a day for 1-2 minutes each exercise for 2-3 weeks until tongue and lip have healed and suck has improved on the breast 7. Continue pumping 2-3 times a day to protect milk supply, milk can be fed to infant or stored if not needed by infant.  8. Keep up the good work 9. Thank you for allowing me to assist you today 10. Please call with any questions/concerns as needed [phone] 11. Follow up with Lactation as needed   Ed Blalock RN, IBCLC                                                    Silas Flood Basil Buffin 07/10/2018, 10:24 AM

## 2018-07-18 DIAGNOSIS — Z1151 Encounter for screening for human papillomavirus (HPV): Secondary | ICD-10-CM | POA: Diagnosis not present

## 2018-07-18 DIAGNOSIS — Z130 Encounter for screening for diseases of the blood and blood-forming organs and certain disorders involving the immune mechanism: Secondary | ICD-10-CM | POA: Diagnosis not present

## 2018-07-18 DIAGNOSIS — Z124 Encounter for screening for malignant neoplasm of cervix: Secondary | ICD-10-CM | POA: Diagnosis not present

## 2018-07-18 DIAGNOSIS — N870 Mild cervical dysplasia: Secondary | ICD-10-CM | POA: Diagnosis not present

## 2018-07-18 DIAGNOSIS — F419 Anxiety disorder, unspecified: Secondary | ICD-10-CM | POA: Diagnosis not present

## 2018-07-19 DIAGNOSIS — O9213 Cracked nipple associated with lactation: Secondary | ICD-10-CM | POA: Diagnosis not present

## 2018-07-19 DIAGNOSIS — Q381 Ankyloglossia: Secondary | ICD-10-CM | POA: Diagnosis not present

## 2018-07-21 DIAGNOSIS — R52 Pain, unspecified: Secondary | ICD-10-CM | POA: Diagnosis not present

## 2018-07-21 DIAGNOSIS — B349 Viral infection, unspecified: Secondary | ICD-10-CM | POA: Diagnosis not present

## 2018-10-10 DIAGNOSIS — L659 Nonscarring hair loss, unspecified: Secondary | ICD-10-CM | POA: Diagnosis not present

## 2018-10-10 DIAGNOSIS — D649 Anemia, unspecified: Secondary | ICD-10-CM | POA: Diagnosis not present

## 2018-10-10 DIAGNOSIS — Z131 Encounter for screening for diabetes mellitus: Secondary | ICD-10-CM | POA: Diagnosis not present

## 2018-10-10 DIAGNOSIS — Z0000 Encounter for general adult medical examination without abnormal findings: Secondary | ICD-10-CM | POA: Diagnosis not present

## 2019-03-11 ENCOUNTER — Ambulatory Visit: Payer: BC Managed Care – PPO | Admitting: Psychology

## 2019-04-03 DIAGNOSIS — N941 Unspecified dyspareunia: Secondary | ICD-10-CM | POA: Diagnosis not present

## 2019-05-20 ENCOUNTER — Other Ambulatory Visit: Payer: Self-pay

## 2019-05-20 DIAGNOSIS — Z20822 Contact with and (suspected) exposure to covid-19: Secondary | ICD-10-CM

## 2019-05-21 LAB — NOVEL CORONAVIRUS, NAA: SARS-CoV-2, NAA: NOT DETECTED

## 2019-05-22 ENCOUNTER — Other Ambulatory Visit: Payer: Self-pay

## 2019-05-22 DIAGNOSIS — Z20822 Contact with and (suspected) exposure to covid-19: Secondary | ICD-10-CM

## 2019-05-22 DIAGNOSIS — Z20828 Contact with and (suspected) exposure to other viral communicable diseases: Secondary | ICD-10-CM | POA: Diagnosis not present

## 2019-05-23 LAB — NOVEL CORONAVIRUS, NAA: SARS-CoV-2, NAA: NOT DETECTED

## 2019-06-13 DIAGNOSIS — Z20828 Contact with and (suspected) exposure to other viral communicable diseases: Secondary | ICD-10-CM | POA: Diagnosis not present

## 2019-07-10 IMAGING — RF DG HYSTEROGRAM
5 series · 5 of 5 positions shown · IV contrast (omnipaque)
Comparison: None.

CLINICAL DATA: Infertility workup

EXAM:
HYSTEROSALPINGOGRAM
TECHNIQUE: Following cleansing of the cervix and vagina with Betadine solution,
a hysterosalpingogram was performed using a 5-French
hysterosalpingogram catheter and Omnipaque 300 contrast. The patient
tolerated the examination without difficulty.

[Series 1: run · 1 of 1 slices shown (1 of 5)]
[im 1/1]
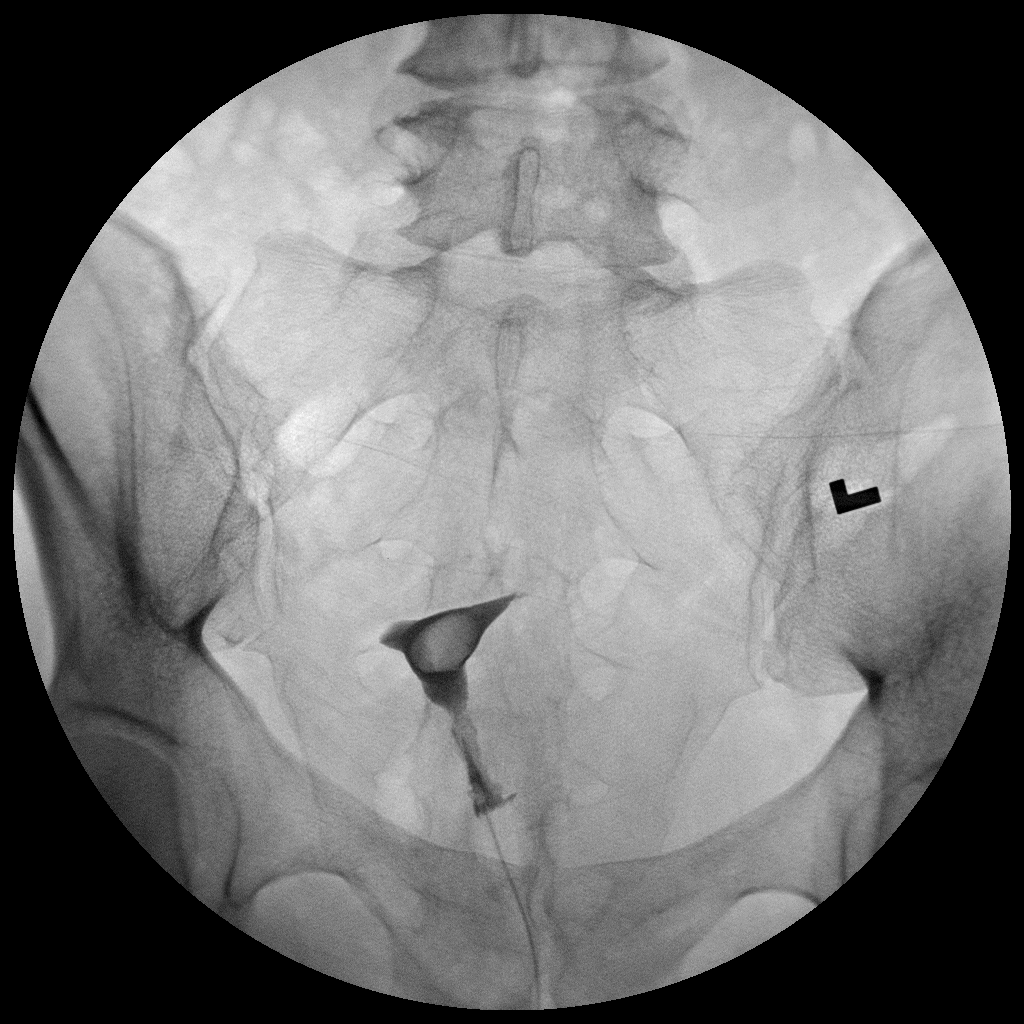

[Series 2: run · 1 of 1 slices shown (2 of 5)]
[im 1/1]
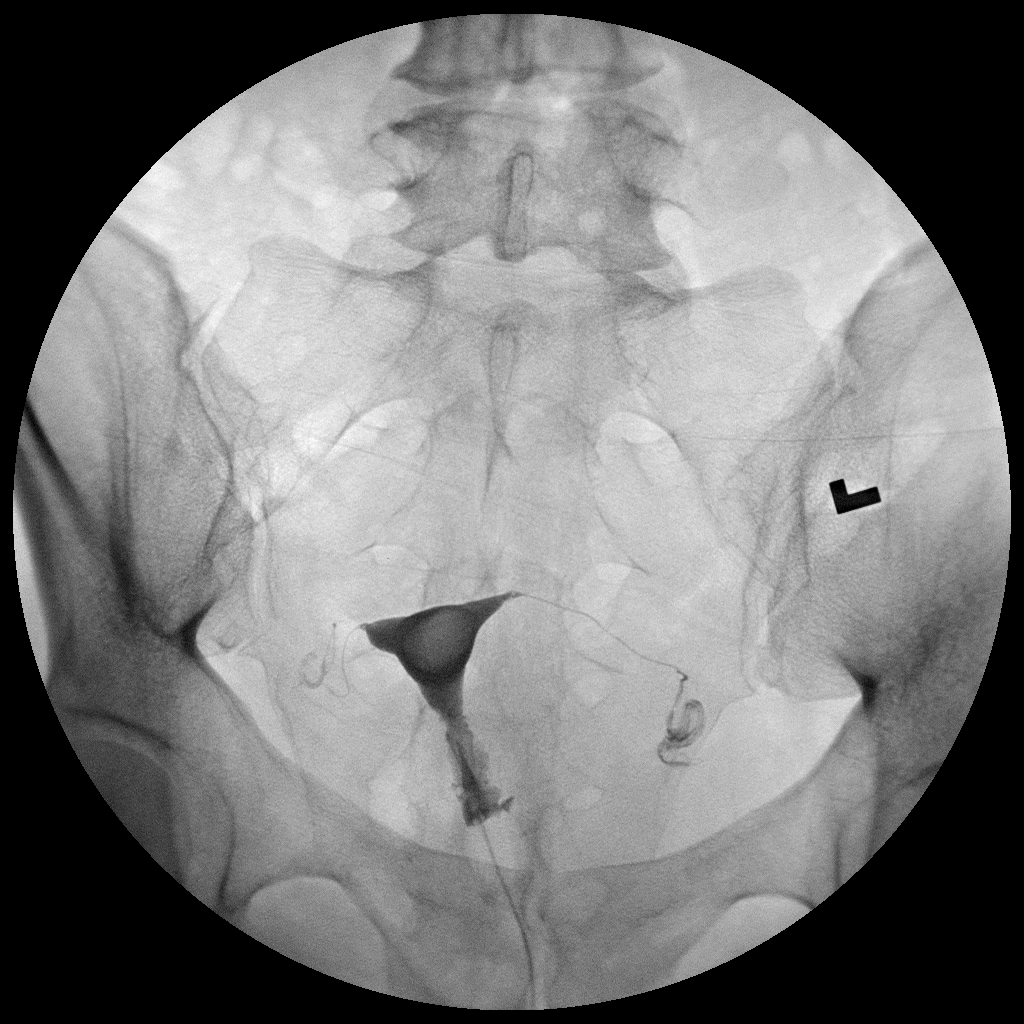

[Series 3: run · 1 of 1 slices shown (3 of 5)]
[im 1/1]
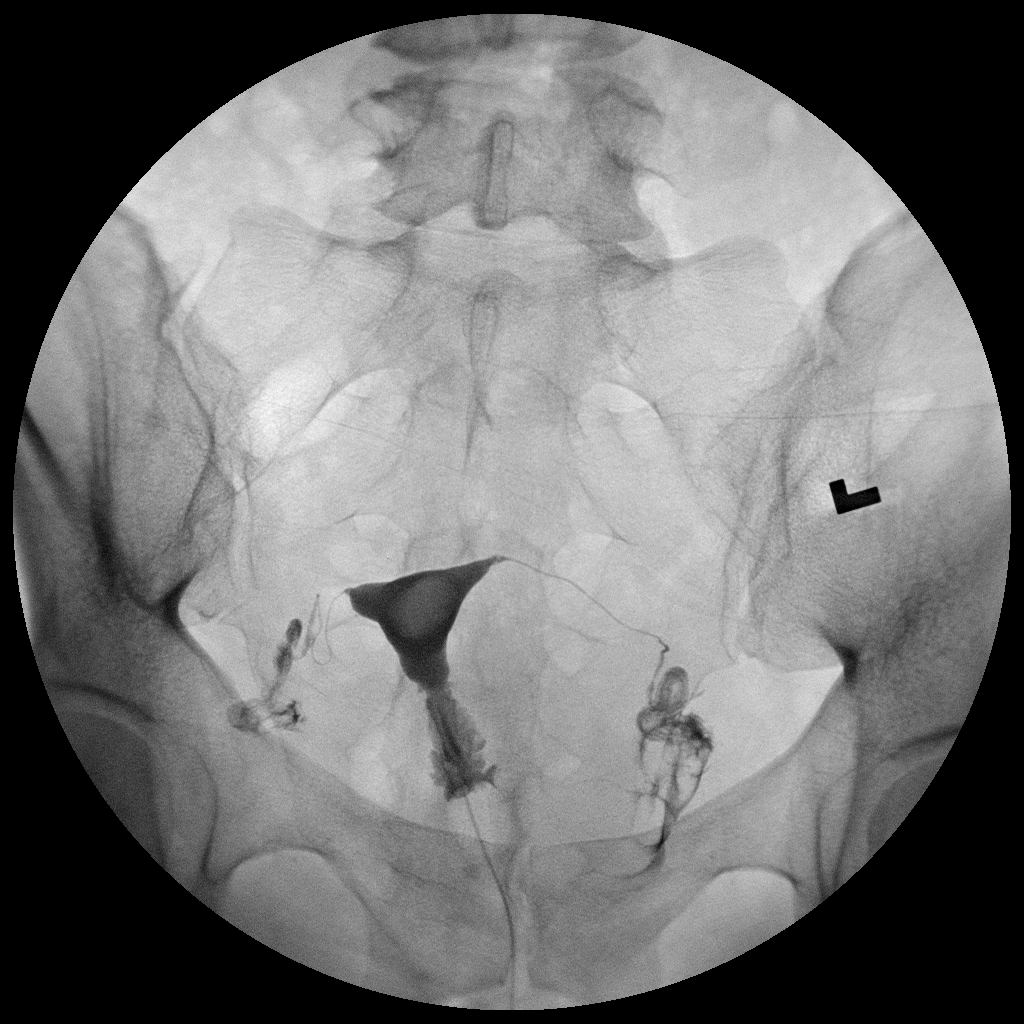

[Series 4: run · 1 of 1 slices shown (4 of 5)]
[im 1/1]
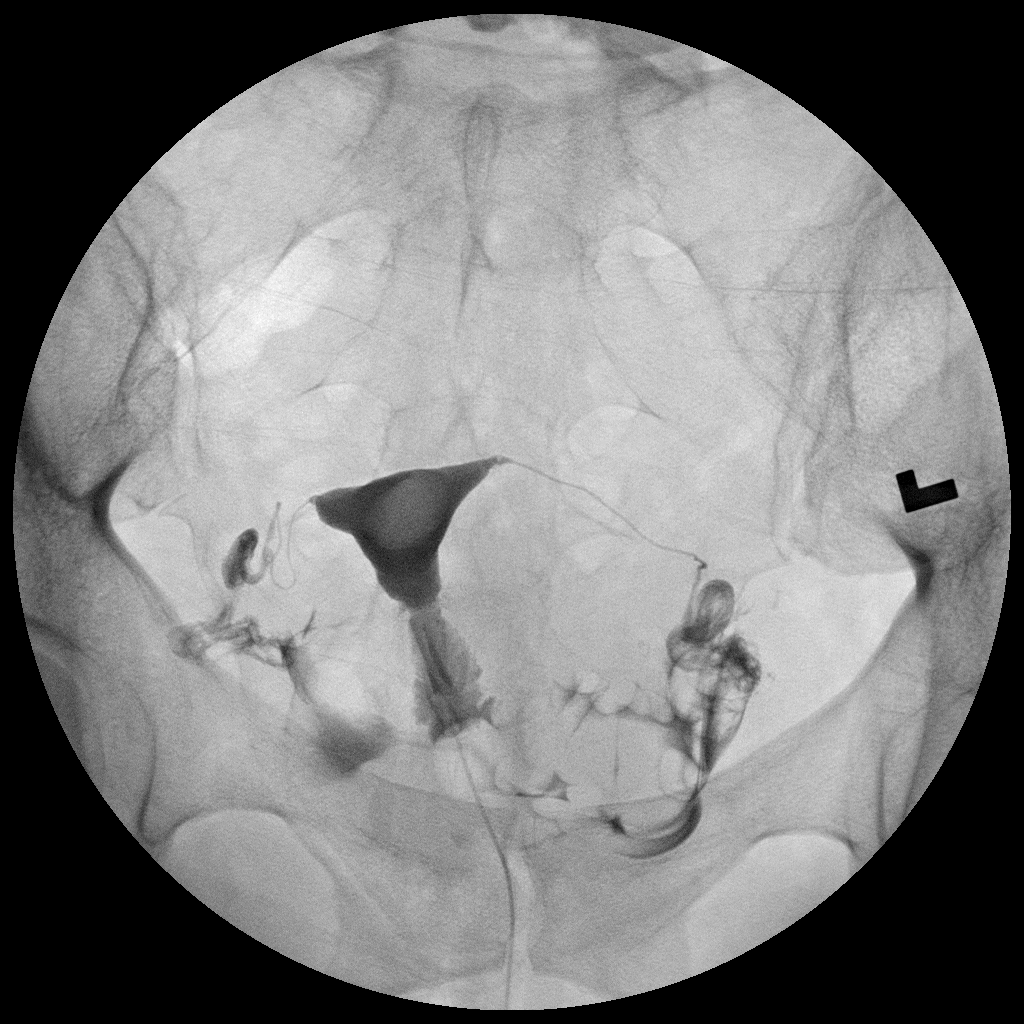

[Series 5: run · 1 of 1 slices shown (5 of 5)]
[im 1/1]
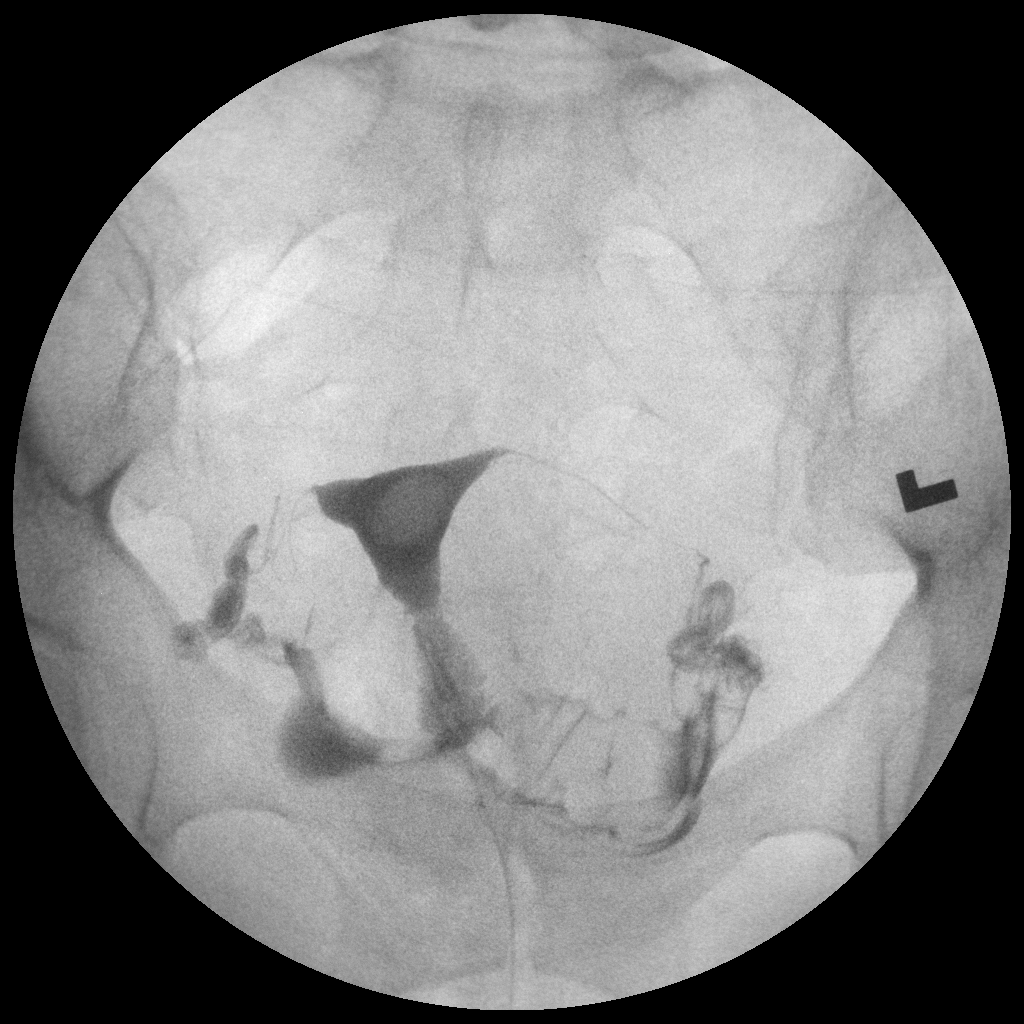

[5 of 5 positions shown; findings below may reference images not displayed]

FLUOROSCOPY TIME:  Radiation Exposure Index (as provided by the
fluoroscopic device):

If the device does not provide the exposure index:

Fluoroscopy Time:  48 seconds

Number of Acquired Images:  4
FINDINGS: There is a rounded filling defect within the endometrial cavity
centrally. Both fallopian tubes fill and have a normal appearance.
Bilateral spillage noted.
IMPRESSION: Patent and normal-appearing fallopian tubes bilaterally.

Rounded filling defect centrally within the endometrial canal. This
could reflect a submucosal or pedunculated fibroid or endometrial
polyp. Recommend further evaluation with pelvic ultrasound.

## 2019-08-09 NOTE — L&D Delivery Note (Signed)
Delivery Note:   G3P1011 at [redacted]w[redacted]d  Admitting diagnosis: Normal labor [O80, Z37.9] Risks: AMA, GBS positive Onset of labor: 1630 IOL/Augmentation: N/A ROM: 2151 - light meconium   Complete dilation at 05/07/2020  @ 2200 Onset of pushing at 2200 FHR second stage Category 2  Analgesia /Anesthesia intrapartum:None  Pushing in right side-lying position with CNM and L&D staff support, FOB [place], and Maralyn Sago (doula) present for birth and supportive.  Delivery of a Live born female  Birth Weight:   APGAR: 8, 9  Newborn Delivery   Birth date/time: 05/07/2020 22:05:00 Delivery type: Vaginal, Spontaneous      in cephalic presentation, position ROT to ROA with compound hand.  APGAR:1 min-8 , 5 min-9   Nuchal Cord: No  Cord double clamped after cessation of pulsation, cut by FOB.  Collection of cord blood for typing completed. Cord blood donation-None  Arterial cord blood sample-No    Placenta delivered-Spontaneous  with 3 vessels . Uterotonics: IV Pitocin Placenta to hospital disposal. Uterine tone firm with massage bleeding minimal    Small first degree perineal laceration identified.  Episiotomy:None  Local analgesia: 1% Lidocaine  Repair: 3.0 Vicryl  Est. Blood Loss (mL):66.00   Complications: None   Mom to postpartum.  Baby to Couplet care / Skin to Skin.  Delivery Report:  Review the Delivery Report for details.     Signed: Karena Addison, CNM, MSN 05/07/2020, 10:57 PM

## 2019-08-30 DIAGNOSIS — Z3689 Encounter for other specified antenatal screening: Secondary | ICD-10-CM | POA: Diagnosis not present

## 2019-08-30 DIAGNOSIS — Z3200 Encounter for pregnancy test, result unknown: Secondary | ICD-10-CM | POA: Diagnosis not present

## 2019-09-20 DIAGNOSIS — Z3201 Encounter for pregnancy test, result positive: Secondary | ICD-10-CM | POA: Diagnosis not present

## 2019-09-27 DIAGNOSIS — O09511 Supervision of elderly primigravida, first trimester: Secondary | ICD-10-CM | POA: Diagnosis not present

## 2019-09-27 DIAGNOSIS — Z3689 Encounter for other specified antenatal screening: Secondary | ICD-10-CM | POA: Diagnosis not present

## 2019-09-27 DIAGNOSIS — Z3A08 8 weeks gestation of pregnancy: Secondary | ICD-10-CM | POA: Diagnosis not present

## 2019-10-14 DIAGNOSIS — O99820 Streptococcus B carrier state complicating pregnancy: Secondary | ICD-10-CM | POA: Diagnosis not present

## 2019-10-14 DIAGNOSIS — Z124 Encounter for screening for malignant neoplasm of cervix: Secondary | ICD-10-CM | POA: Diagnosis not present

## 2019-10-14 DIAGNOSIS — Z118 Encounter for screening for other infectious and parasitic diseases: Secondary | ICD-10-CM | POA: Diagnosis not present

## 2019-10-14 DIAGNOSIS — Z3A10 10 weeks gestation of pregnancy: Secondary | ICD-10-CM | POA: Diagnosis not present

## 2019-10-14 DIAGNOSIS — O09511 Supervision of elderly primigravida, first trimester: Secondary | ICD-10-CM | POA: Diagnosis not present

## 2019-10-14 DIAGNOSIS — Z1151 Encounter for screening for human papillomavirus (HPV): Secondary | ICD-10-CM | POA: Diagnosis not present

## 2019-10-14 DIAGNOSIS — Z3689 Encounter for other specified antenatal screening: Secondary | ICD-10-CM | POA: Diagnosis not present

## 2019-11-07 DIAGNOSIS — H5213 Myopia, bilateral: Secondary | ICD-10-CM | POA: Diagnosis not present

## 2019-11-15 DIAGNOSIS — O09512 Supervision of elderly primigravida, second trimester: Secondary | ICD-10-CM | POA: Diagnosis not present

## 2019-11-15 DIAGNOSIS — Z361 Encounter for antenatal screening for raised alphafetoprotein level: Secondary | ICD-10-CM | POA: Diagnosis not present

## 2019-11-15 DIAGNOSIS — Z3A15 15 weeks gestation of pregnancy: Secondary | ICD-10-CM | POA: Diagnosis not present

## 2019-12-08 DIAGNOSIS — Z20828 Contact with and (suspected) exposure to other viral communicable diseases: Secondary | ICD-10-CM | POA: Diagnosis not present

## 2019-12-08 DIAGNOSIS — Z03818 Encounter for observation for suspected exposure to other biological agents ruled out: Secondary | ICD-10-CM | POA: Diagnosis not present

## 2019-12-12 DIAGNOSIS — O09512 Supervision of elderly primigravida, second trimester: Secondary | ICD-10-CM | POA: Diagnosis not present

## 2019-12-12 DIAGNOSIS — Z3A19 19 weeks gestation of pregnancy: Secondary | ICD-10-CM | POA: Diagnosis not present

## 2020-01-01 ENCOUNTER — Ambulatory Visit: Payer: Self-pay | Attending: Internal Medicine

## 2020-01-01 DIAGNOSIS — Z23 Encounter for immunization: Secondary | ICD-10-CM

## 2020-01-01 NOTE — Progress Notes (Signed)
   Covid-19 Vaccination Clinic  Name:  Ritha Sampedro    MRN: [id number] DOB: 1981/05/16  01/01/2020  Ms. Comas was observed post Covid-19 immunization for 15 minutes without incident. She was provided with Vaccine Information Sheet and instruction to access the V-Safe system.   Ms. Bines was instructed to call 911 with any severe reactions post vaccine: Marland Kitchen Difficulty breathing  . Swelling of face and throat  . A fast heartbeat  . A bad rash all over body  . Dizziness and weakness   Immunizations Administered    Name Date Dose VIS Date Route   Pfizer COVID-19 Vaccine 01/01/2020  9:50 AM 0.3 mL 10/02/2018 Intramuscular   Manufacturer: [employer], [employer]   Lot: [id number]   NDC: [address]-7

## 2020-01-05 DIAGNOSIS — Z20828 Contact with and (suspected) exposure to other viral communicable diseases: Secondary | ICD-10-CM | POA: Diagnosis not present

## 2020-01-05 DIAGNOSIS — Z20822 Contact with and (suspected) exposure to covid-19: Secondary | ICD-10-CM | POA: Diagnosis not present

## 2020-01-09 DIAGNOSIS — Z3A23 23 weeks gestation of pregnancy: Secondary | ICD-10-CM | POA: Diagnosis not present

## 2020-01-09 DIAGNOSIS — O09512 Supervision of elderly primigravida, second trimester: Secondary | ICD-10-CM | POA: Diagnosis not present

## 2020-01-23 ENCOUNTER — Ambulatory Visit: Payer: Self-pay | Attending: Internal Medicine

## 2020-01-23 DIAGNOSIS — Z23 Encounter for immunization: Secondary | ICD-10-CM

## 2020-01-23 NOTE — Progress Notes (Signed)
   Covid-19 Vaccination Clinic  Name:  Debbie Lloyd    MRN: [id number] DOB: 06-Apr-1981  01/23/2020  [name] was observed post Covid-19 immunization for 15 minutes without incident. She was provided with Vaccine Information Sheet and instruction to access the V-Safe system.   [name] was instructed to call 911 with any severe reactions post vaccine: Marland Kitchen Difficulty breathing  . Swelling of face and throat  . A fast heartbeat  . A bad rash all over body  . Dizziness and weakness   Immunizations Administered    Name Date Dose VIS Date Route   Pfizer COVID-19 Vaccine 01/23/2020  9:52 AM 0.3 mL 10/02/2018 Intramuscular   Manufacturer: [employer], [employer]   Lot: [id number]   NDC: [address]-2

## 2020-01-23 NOTE — Progress Notes (Signed)
   Covid-19 Vaccination Clinic  Name:  Debbie Lloyd    MRN: [id number] DOB: 05/05/1981  01/23/2020  Ms. Debbie Lloyd was observed post Covid-19 immunization for 15 minutes without incident. She was provided with Vaccine Information Sheet and instruction to access the V-Safe system.   Ms. Debbie Lloyd was instructed to call 911 with any severe reactions post vaccine: . Difficulty breathing  . Swelling of face and throat  . A fast heartbeat  . A bad rash all over body  . Dizziness and weakness   Immunizations Administered    Name Date Dose VIS Date Route   Pfizer COVID-19 Vaccine 01/23/2020  9:52 AM 0.3 mL 10/02/2018 Intramuscular   Manufacturer: Pfizer, Inc   Lot: [id number]   NDC: [address]-2     

## 2020-01-27 ENCOUNTER — Ambulatory Visit: Payer: Self-pay

## 2020-02-05 DIAGNOSIS — Z3689 Encounter for other specified antenatal screening: Secondary | ICD-10-CM | POA: Diagnosis not present

## 2020-02-05 DIAGNOSIS — Z3A26 26 weeks gestation of pregnancy: Secondary | ICD-10-CM | POA: Diagnosis not present

## 2020-02-05 DIAGNOSIS — O09512 Supervision of elderly primigravida, second trimester: Secondary | ICD-10-CM | POA: Diagnosis not present

## 2020-02-18 DIAGNOSIS — O09513 Supervision of elderly primigravida, third trimester: Secondary | ICD-10-CM | POA: Diagnosis not present

## 2020-02-18 DIAGNOSIS — Z23 Encounter for immunization: Secondary | ICD-10-CM | POA: Diagnosis not present

## 2020-02-18 DIAGNOSIS — Z3A28 28 weeks gestation of pregnancy: Secondary | ICD-10-CM | POA: Diagnosis not present

## 2020-03-05 DIAGNOSIS — Z3A31 31 weeks gestation of pregnancy: Secondary | ICD-10-CM | POA: Diagnosis not present

## 2020-03-05 DIAGNOSIS — O09513 Supervision of elderly primigravida, third trimester: Secondary | ICD-10-CM | POA: Diagnosis not present

## 2020-03-13 DIAGNOSIS — O09513 Supervision of elderly primigravida, third trimester: Secondary | ICD-10-CM | POA: Diagnosis not present

## 2020-03-13 DIAGNOSIS — Z3A32 32 weeks gestation of pregnancy: Secondary | ICD-10-CM | POA: Diagnosis not present

## 2020-03-24 DIAGNOSIS — Z3A33 33 weeks gestation of pregnancy: Secondary | ICD-10-CM | POA: Diagnosis not present

## 2020-03-24 DIAGNOSIS — O365930 Maternal care for other known or suspected poor fetal growth, third trimester, not applicable or unspecified: Secondary | ICD-10-CM | POA: Diagnosis not present

## 2020-03-24 DIAGNOSIS — O09513 Supervision of elderly primigravida, third trimester: Secondary | ICD-10-CM | POA: Diagnosis not present

## 2020-03-27 DIAGNOSIS — Z20822 Contact with and (suspected) exposure to covid-19: Secondary | ICD-10-CM | POA: Diagnosis not present

## 2020-03-27 DIAGNOSIS — Z03818 Encounter for observation for suspected exposure to other biological agents ruled out: Secondary | ICD-10-CM | POA: Diagnosis not present

## 2020-04-02 DIAGNOSIS — O26843 Uterine size-date discrepancy, third trimester: Secondary | ICD-10-CM | POA: Diagnosis not present

## 2020-04-02 DIAGNOSIS — O09513 Supervision of elderly primigravida, third trimester: Secondary | ICD-10-CM | POA: Diagnosis not present

## 2020-04-02 DIAGNOSIS — Z3A35 35 weeks gestation of pregnancy: Secondary | ICD-10-CM | POA: Diagnosis not present

## 2020-04-09 DIAGNOSIS — O26843 Uterine size-date discrepancy, third trimester: Secondary | ICD-10-CM | POA: Diagnosis not present

## 2020-04-09 DIAGNOSIS — Z3A36 36 weeks gestation of pregnancy: Secondary | ICD-10-CM | POA: Diagnosis not present

## 2020-04-16 DIAGNOSIS — O26843 Uterine size-date discrepancy, third trimester: Secondary | ICD-10-CM | POA: Diagnosis not present

## 2020-04-16 DIAGNOSIS — Z3A37 37 weeks gestation of pregnancy: Secondary | ICD-10-CM | POA: Diagnosis not present

## 2020-04-22 DIAGNOSIS — O365930 Maternal care for other known or suspected poor fetal growth, third trimester, not applicable or unspecified: Secondary | ICD-10-CM | POA: Diagnosis not present

## 2020-04-22 DIAGNOSIS — Z3A37 37 weeks gestation of pregnancy: Secondary | ICD-10-CM | POA: Diagnosis not present

## 2020-04-28 DIAGNOSIS — O365930 Maternal care for other known or suspected poor fetal growth, third trimester, not applicable or unspecified: Secondary | ICD-10-CM | POA: Diagnosis not present

## 2020-04-28 DIAGNOSIS — Z3A38 38 weeks gestation of pregnancy: Secondary | ICD-10-CM | POA: Diagnosis not present

## 2020-05-06 DIAGNOSIS — Z3A39 39 weeks gestation of pregnancy: Secondary | ICD-10-CM | POA: Diagnosis not present

## 2020-05-06 DIAGNOSIS — O365930 Maternal care for other known or suspected poor fetal growth, third trimester, not applicable or unspecified: Secondary | ICD-10-CM | POA: Diagnosis not present

## 2020-05-07 ENCOUNTER — Inpatient Hospital Stay (HOSPITAL_COMMUNITY)
Admission: AD | Admit: 2020-05-07 | Discharge: 2020-05-09 | DRG: 807 | Disposition: A | Discharge status: Home / Self Care | Payer: BC Managed Care – PPO | Attending: Obstetrics & Gynecology | Admitting: Obstetrics & Gynecology

## 2020-05-07 ENCOUNTER — Other Ambulatory Visit: Payer: Self-pay

## 2020-05-07 ENCOUNTER — Inpatient Hospital Stay (HOSPITAL_COMMUNITY): Admission: AD | Admit: 2020-05-07 | Payer: BC Managed Care – PPO | Source: Home / Self Care

## 2020-05-07 ENCOUNTER — Inpatient Hospital Stay (HOSPITAL_COMMUNITY)
Admission: AD | Admit: 2020-05-07 | Discharge: 2020-05-07 | Disposition: A | Discharge status: Home / Self Care | Payer: BC Managed Care – PPO | Source: Home / Self Care | Attending: Obstetrics and Gynecology | Admitting: Obstetrics and Gynecology

## 2020-05-07 ENCOUNTER — Encounter (HOSPITAL_COMMUNITY): Payer: Self-pay | Admitting: Obstetrics & Gynecology

## 2020-05-07 ENCOUNTER — Encounter (HOSPITAL_COMMUNITY): Payer: Self-pay | Admitting: Obstetrics

## 2020-05-07 DIAGNOSIS — Z23 Encounter for immunization: Secondary | ICD-10-CM | POA: Diagnosis not present

## 2020-05-07 DIAGNOSIS — O99824 Streptococcus B carrier state complicating childbirth: Secondary | ICD-10-CM | POA: Diagnosis present

## 2020-05-07 DIAGNOSIS — O99613 Diseases of the digestive system complicating pregnancy, third trimester: Secondary | ICD-10-CM | POA: Insufficient documentation

## 2020-05-07 DIAGNOSIS — O471 False labor at or after 37 completed weeks of gestation: Secondary | ICD-10-CM

## 2020-05-07 DIAGNOSIS — O26893 Other specified pregnancy related conditions, third trimester: Secondary | ICD-10-CM | POA: Diagnosis not present

## 2020-05-07 DIAGNOSIS — Z20822 Contact with and (suspected) exposure to covid-19: Secondary | ICD-10-CM | POA: Diagnosis present

## 2020-05-07 DIAGNOSIS — K219 Gastro-esophageal reflux disease without esophagitis: Secondary | ICD-10-CM | POA: Insufficient documentation

## 2020-05-07 DIAGNOSIS — Z3A40 40 weeks gestation of pregnancy: Secondary | ICD-10-CM | POA: Insufficient documentation

## 2020-05-07 DIAGNOSIS — O365930 Maternal care for other known or suspected poor fetal growth, third trimester, not applicable or unspecified: Principal | ICD-10-CM | POA: Diagnosis present

## 2020-05-07 DIAGNOSIS — O480 Post-term pregnancy: Secondary | ICD-10-CM | POA: Insufficient documentation

## 2020-05-07 DIAGNOSIS — R8271 Bacteriuria: Secondary | ICD-10-CM | POA: Insufficient documentation

## 2020-05-07 LAB — CBC
HCT: 41.9 % (ref 36.0–46.0)
Hemoglobin: 13.8 g/dL (ref 12.0–15.0)
MCH: 27.8 pg (ref 26.0–34.0)
MCHC: 32.9 g/dL (ref 30.0–36.0)
MCV: 84.3 fL (ref 80.0–100.0)
Platelets: 246 10*3/uL (ref 150–400)
RBC: 4.97 MIL/uL (ref 3.87–5.11)
RDW: 13.4 % (ref 11.5–15.5)
WBC: 10.6 10*3/uL — ABNORMAL HIGH (ref 4.0–10.5)
nRBC: 0 % (ref 0.0–0.2)

## 2020-05-07 LAB — TYPE AND SCREEN
ABO/RH(D): B POS
Antibody Screen: NEGATIVE

## 2020-05-07 LAB — RESPIRATORY PANEL BY RT PCR (FLU A&B, COVID)
Influenza A by PCR: NEGATIVE
Influenza B by PCR: NEGATIVE
SARS Coronavirus 2 by RT PCR: NEGATIVE

## 2020-05-07 LAB — OB RESULTS CONSOLE GBS: GBS: POSITIVE

## 2020-05-07 MED ORDER — OXYTOCIN BOLUS FROM INFUSION
333.0000 mL | Freq: Once | INTRAVENOUS | Status: AC
  Administered 2020-05-07: 333 mL via INTRAVENOUS

## 2020-05-07 MED ORDER — FENTANYL CITRATE (PF) 100 MCG/2ML IJ SOLN: 100.0000 ug | Freq: Once | INTRAMUSCULAR | Status: DC

## 2020-05-07 MED ORDER — PENICILLIN G POT IN DEXTROSE 60000 UNIT/ML IV SOLN
3.0000 10*6.[IU] | INTRAVENOUS | Status: DC
  Administered 2020-05-07: 3 10*6.[IU] via INTRAVENOUS
  Filled 2020-05-07: qty 50

## 2020-05-07 MED ORDER — OXYTOCIN-SODIUM CHLORIDE 30-0.9 UT/500ML-% IV SOLN
2.5000 [IU]/h | INTRAVENOUS | Status: DC
  Administered 2020-05-07: 2.5 [IU]/h via INTRAVENOUS
  Filled 2020-05-07: qty 500

## 2020-05-07 MED ORDER — SODIUM CHLORIDE 0.9 % IV SOLN
5.0000 10*6.[IU] | Freq: Once | INTRAVENOUS | Status: AC
  Administered 2020-05-07: 5 10*6.[IU] via INTRAVENOUS
  Filled 2020-05-07: qty 5

## 2020-05-07 MED ORDER — FENTANYL CITRATE (PF) 100 MCG/2ML IJ SOLN
INTRAMUSCULAR | Status: AC
  Administered 2020-05-07: 100 ug via INTRAVENOUS
  Filled 2020-05-07: qty 2

## 2020-05-07 MED ORDER — ACETAMINOPHEN 325 MG PO TABS: 650.0000 mg | ORAL_TABLET | ORAL | Status: DC | PRN

## 2020-05-07 MED ORDER — SOD CITRATE-CITRIC ACID 500-334 MG/5ML PO SOLN: 30.0000 mL | ORAL | Status: DC | PRN

## 2020-05-07 MED ORDER — LACTATED RINGERS IV SOLN: 500.0000 mL | INTRAVENOUS | Status: DC | PRN

## 2020-05-07 MED ORDER — LIDOCAINE HCL (PF) 1 % IJ SOLN
30.0000 mL | INTRAMUSCULAR | Status: AC | PRN
  Administered 2020-05-07: 30 mL via SUBCUTANEOUS
  Filled 2020-05-07: qty 30

## 2020-05-07 MED ORDER — ONDANSETRON HCL 4 MG/2ML IJ SOLN: 4.0000 mg | Freq: Four times a day (QID) | INTRAMUSCULAR | Status: DC | PRN

## 2020-05-07 MED ORDER — LACTATED RINGERS IV SOLN: INTRAVENOUS | Status: DC

## 2020-05-07 NOTE — H&P (Signed)
Debbie Lloyd is a 39 y.o. female presenting for labor contractions. She was seen in MAU this morning with prodromal labor, and was discharged home and called with painful contractions around 4:45pm. Spouse, [place], present and supportive. Using Natural baby doulas for labor support.   Pregnancy history complicated by: - AMA - SGA - GBS bacteruria  OB History    Gravida  3   Para  1   Term  1   Preterm      AB  1   Living  1     SAB  1   TAB      Ectopic      Multiple  0   Live Births  1          Past Medical History:  Diagnosis Date  . Anemia   . Anxiety   . GERD (gastroesophageal reflux disease)   . Idiopathic hirsutism   . Vaginal Pap smear, abnormal    Past Surgical History:  Procedure Laterality Date  . COLONOSCOPY  2008  . COLPOSCOPY    . DILATATION & CURETTAGE/HYSTEROSCOPY WITH MYOSURE N/A 04/17/2017   Procedure: DILATATION & CURETTAGE/HYSTEROSCOPY WITH MYOSURE;  Surgeon: Maxie Better, MD;  Location: [facility];  Service: Gynecology;  Laterality: N/A;  . HEMORROIDECTOMY  02/2015   Family History: family history includes Diabetes in her father and maternal grandmother; Hypertension in her maternal grandmother; Stroke in her maternal grandfather. Social History:  reports that she has never smoked. She has never used smokeless tobacco. She reports previous alcohol use of about 4.0 standard drinks of alcohol per week. She reports that she does not use drugs.     Maternal Diabetes: No Genetic Screening: Normal Maternal Ultrasounds/Referrals: Other:SGA Fetal Ultrasounds or other Referrals:  Other: Weekly surveillance and normal UAD for SGA Maternal Substance Abuse:  No Significant Maternal Medications:  None Significant Maternal Lab Results:  Group B Strep positive Other Comments:  None  Review of Systems  Genitourinary: Negative for vaginal bleeding.       Painful uterine contractions   All other systems reviewed and are negative.  Maternal  Medical History:  Reason for admission: Contractions.   Contractions: Onset was 6-12 hours ago.   Frequency: regular.   Duration is approximately 60 seconds.   Perceived severity is moderate.    Fetal activity: Perceived fetal activity is normal.    Prenatal complications: SGA  Prenatal Complications - Diabetes: none.    Dilation: 5.5 Effacement (%): 100 Station: -1, 0 Exam by:: Carlean Jews, CNM Blood pressure 123/75, pulse 71, temperature 97.8 F (36.6 C), temperature source Oral, height 5' 2.5" (1.588 m), weight 65.9 kg, unknown if currently breastfeeding. Maternal Exam:  Uterine Assessment: Contraction strength is moderate.  Contraction frequency is regular.   Abdomen: Patient reports no abdominal tenderness. Fetal presentation: vertex  Introitus: Normal vulva. Pelvis: adequate for delivery.      Fetal Exam Fetal Monitor Review: Baseline rate: 140-145.  Variability: moderate (6-25 bpm).   Pattern: accelerations present.   2 questionable late decelerations upon arrival with resolution  Fetal State Assessment: Category I - tracings are normal.     Physical Exam Vitals and nursing note reviewed. Exam conducted with a chaperone present.  HENT:     Head: Normocephalic.  Cardiovascular:     Rate and Rhythm: Normal rate.  Genitourinary:    General: Normal vulva.  Musculoskeletal:        General: Normal range of motion.  Skin:    General: Skin is dry.  Neurological:     Mental Status: She is alert.     Prenatal labs: ABO, Rh: --/--/B POS (09/30 1732) Antibody: NEG (09/30 1732) Rubella:  Immune RPR:   NR HBsAg:   Negative HIV:   NR GBS: Positive/-- (09/30 0000)   Assessment/Plan: 1. 40 week IUP in labor     - Routine labor and delivery orders    - Desires low intervention birth     - Membrane sweep performed per patient request     - Fentanyl q1hr PRN     - Continuous fetal monitoring     - Light laboring diet  2. GBS positive - PCN q  4hrs   Anticipate NSVD   - Proven pelvis: 6#7.Dalbert Garnet Damira Kem 05/07/2020, 7:13 PM

## 2020-05-07 NOTE — Discharge Instructions (Signed)
Braxton Hicks Contractions °Contractions of the uterus can occur throughout pregnancy, but they are not always a sign that you are in labor. You may have practice contractions called Braxton Hicks contractions. These false labor contractions are sometimes confused with true labor. °What are Braxton Hicks contractions? °Braxton Hicks contractions are tightening movements that occur in the muscles of the uterus before labor. Unlike true labor contractions, these contractions do not result in opening (dilation) and thinning of the cervix. Toward the end of pregnancy (32-34 weeks), Braxton Hicks contractions can happen more often and may become stronger. These contractions are sometimes difficult to tell apart from true labor because they can be very uncomfortable. You should not feel embarrassed if you go to the hospital with false labor. °Sometimes, the only way to tell if you are in true labor is for your health care provider to look for changes in the cervix. The health care provider will do a physical exam and may monitor your contractions. If you are not in true labor, the exam should show that your cervix is not dilating and your water has not broken. °If there are no other health problems associated with your pregnancy, it is completely safe for you to be sent home with false labor. You may continue to have Braxton Hicks contractions until you go into true labor. °How to tell the difference between true labor and false labor °True labor °· Contractions last 30-70 seconds. °· Contractions become very regular. °· Discomfort is usually felt in the top of the uterus, and it spreads to the lower abdomen and low back. °· Contractions do not go away with walking. °· Contractions usually become more intense and increase in frequency. °· The cervix dilates and gets thinner. °False labor °· Contractions are usually shorter and not as strong as true labor contractions. °· Contractions are usually irregular. °· Contractions  are often felt in the front of the lower abdomen and in the groin. °· Contractions may go away when you walk around or change positions while lying down. °· Contractions get weaker and are shorter-lasting as time goes on. °· The cervix usually does not dilate or become thin. °Follow these instructions at home: ° °· Take over-the-counter and prescription medicines only as told by your health care provider. °· Keep up with your usual exercises and follow other instructions from your health care provider. °· Eat and drink lightly if you think you are going into labor. °· If Braxton Hicks contractions are making you uncomfortable: °? Change your position from lying down or resting to walking, or change from walking to resting. °? Sit and rest in a tub of warm water. °? Drink enough fluid to keep your urine pale yellow. Dehydration may cause these contractions. °? Do slow and deep breathing several times an hour. °· Keep all follow-up prenatal visits as told by your health care provider. This is important. °Contact a health care provider if: °· You have a fever. °· You have continuous pain in your abdomen. °Get help right away if: °· Your contractions become stronger, more regular, and closer together. °· You have fluid leaking or gushing from your vagina. °· You pass blood-tinged mucus (bloody show). °· You have bleeding from your vagina. °· You have low back pain that you never had before. °· You feel your baby’s head pushing down and causing pelvic pressure. °· Your baby is not moving inside you as much as it used to. °Summary °· Contractions that occur before labor are   called Braxton Hicks contractions, false labor, or practice contractions. °· Braxton Hicks contractions are usually shorter, weaker, farther apart, and less regular than true labor contractions. True labor contractions usually become progressively stronger and regular, and they become more frequent. °· Manage discomfort from Braxton Hicks contractions  by changing position, resting in a warm bath, drinking plenty of water, or practicing deep breathing. °This information is not intended to replace advice given to you by your health care provider. Make sure you discuss any questions you have with your health care provider. °Document Revised: 07/07/2017 Document Reviewed: 12/08/2016 °Elsevier Patient Education © 2020 [employer] Inc. ° °

## 2020-05-07 NOTE — MAU Provider Note (Signed)
History     CSN: [id number]  Arrival date and time: 05/07/20 0607   None     Chief Complaint  Patient presents with  . Labor Eval   HPI Debbie Lloyd is a 39 yo G3P1 at 40 weeks dated by LMP c/w 7 week sono for early labor contractions.  She reports contractions started overnight and became every 5 minutes around 6am and presented to MAU around 6:20am.  She states since arrival at MAU, contractions have spaced now, now having contractions every 10 minutes or longer.  She denies VB or LOF. She endorses good fetal movement. Upon my exam, she had 2 cervical exams by 2 different nurses and essentially unchanged.  Pregnancy history complicated by: - AMA - SGA - GBS bacteruria   OB History    Gravida  3   Para  1   Term  1   Preterm      AB  1   Living  1     SAB  1   TAB      Ectopic      Multiple  0   Live Births  1           Past Medical History:  Diagnosis Date  . Anemia   . Anxiety   . GERD (gastroesophageal reflux disease)   . Idiopathic hirsutism   . Vaginal Pap smear, abnormal     Past Surgical History:  Procedure Laterality Date  . COLONOSCOPY  2008  . COLPOSCOPY    . DILATATION & CURETTAGE/HYSTEROSCOPY WITH MYOSURE N/A 04/17/2017   Procedure: DILATATION & CURETTAGE/HYSTEROSCOPY WITH MYOSURE;  Surgeon: Maxie Better, MD;  Location: [facility];  Service: Gynecology;  Laterality: N/A;  . HEMORROIDECTOMY  02/2015    Family History  Problem Relation Age of Onset  . Diabetes Father   . Diabetes Maternal Grandmother   . Hypertension Maternal Grandmother   . Stroke Maternal Grandfather     Social History   Tobacco Use  . Smoking status: Never Smoker  . Smokeless tobacco: Never Used  Vaping Use  . Vaping Use: Never used  Substance Use Topics  . Alcohol use: Not Currently    Alcohol/week: 4.0 standard drinks    Types: 2 Glasses of wine, 2 Shots of liquor per week  . Drug use: No    Allergies: No Known Allergies  Medications Prior  to Admission  Medication Sig Dispense Refill Last Dose  . calcium carbonate (TUMS - DOSED IN MG ELEMENTAL CALCIUM) 500 MG chewable tablet Chew 1 tablet by mouth daily.     . famotidine (PEPCID) 20 MG tablet Take 20 mg by mouth 2 (two) times daily.   Past Week at Unknown time  . Prenatal Vit-Fe Fumarate-FA (MULTIVITAMIN-PRENATAL) 27-0.8 MG TABS tablet Take 1 tablet by mouth every evening.    05/06/2020 at Unknown time  . folic acid (FOLVITE) 1 MG tablet Take 1 mg by mouth daily.     Marland Kitchen ibuprofen (ADVIL,MOTRIN) 600 MG tablet Take 1 tablet (600 mg total) by mouth every 6 (six) hours. 30 tablet 0   . pantoprazole (PROTONIX) 40 MG tablet Take 40 mg by mouth daily.     . PSYLLIUM PO Take 5 mLs by mouth at bedtime. "One spoonful"       Review of Systems Physical Exam   Blood pressure 120/74, pulse 72, temperature 97.9 F (36.6 C), temperature source Oral, resp. rate 18, height 5' 2.5" (1.588 m), weight 65.3 kg, SpO2 100 %, unknown if currently  breastfeeding.  Physical Exam Constitutional:      Appearance: Normal appearance.  Genitourinary:    General: Normal vulva.     Comments: SVE: 4cm/90/-1; Intact membranes Neurological:     Mental Status: She is alert.  Psychiatric:        Mood and Affect: Mood normal.    Fetal monitoring: Baseline: 130-135 bpm/ moderate variability/+15x15 accels/ no decelerations  Toco: irregular contractions   MAU Course  Procedures   Assessment and Plan  1. False labor vs. Prodromal labor at 40 weeks     - Based on my exam she is 4cm/90/-1. We discussed admission to receive GBS prophylaxis with membrane sweep and buccal Cytotec q4hrs PRN to initiate augmentation vs. Discharge home to await for active labor. Pt. Strongly desires natural, unmedicated and low intervention birth.  She is using Natural Baby Doulas for labor support. After lengthy discussion, patient and husband elect to be discharged home. Strict FMC's daily. Encouraged rest and hydration. Once  rested, we discussed [employer] to help progress prodromal labor. Labor precautions and warning s/s reviewed. If no spontaneous labor this weekend, plan for scheduled ROB.  Pt. May consider augmentation tomorrow or this weekend if no spontaneous labor.   POC in consult with Dr. Billy Coast and agrees.   Karena Addison 05/07/2020, 9:34 AM

## 2020-05-07 NOTE — Progress Notes (Signed)
Debbie Lloyd, CNM stated she will call Dr. Billy Coast regarding POC.

## 2020-05-07 NOTE — Progress Notes (Signed)
Dorisann Frames CNM called in and aware of pt's admission and status. Will reck pt in an hour or so and to call A Jones CNM with update.

## 2020-05-07 NOTE — MAU Note (Signed)
Pt presents with complaint of contractions, denies bleeding , denies ROM

## 2020-05-08 ENCOUNTER — Encounter (HOSPITAL_COMMUNITY): Payer: Self-pay | Admitting: Obstetrics & Gynecology

## 2020-05-08 LAB — CBC
HCT: 35.7 % — ABNORMAL LOW (ref 36.0–46.0)
Hemoglobin: 11.8 g/dL — ABNORMAL LOW (ref 12.0–15.0)
MCH: 27.7 pg (ref 26.0–34.0)
MCHC: 33.1 g/dL (ref 30.0–36.0)
MCV: 83.8 fL (ref 80.0–100.0)
Platelets: 226 10*3/uL (ref 150–400)
RBC: 4.26 MIL/uL (ref 3.87–5.11)
RDW: 13.3 % (ref 11.5–15.5)
WBC: 15.6 10*3/uL — ABNORMAL HIGH (ref 4.0–10.5)
nRBC: 0 % (ref 0.0–0.2)

## 2020-05-08 LAB — RPR: RPR Ser Ql: NONREACTIVE

## 2020-05-08 MED ORDER — WITCH HAZEL-GLYCERIN EX PADS: 1.0000 "application " | MEDICATED_PAD | CUTANEOUS | Status: DC | PRN

## 2020-05-08 MED ORDER — SIMETHICONE 80 MG PO CHEW: 80.0000 mg | CHEWABLE_TABLET | ORAL | Status: DC | PRN

## 2020-05-08 MED ORDER — TETANUS-DIPHTH-ACELL PERTUSSIS 5-2.5-18.5 LF-MCG/0.5 IM SUSP: 0.5000 mL | Freq: Once | INTRAMUSCULAR | Status: DC

## 2020-05-08 MED ORDER — ACETAMINOPHEN 325 MG PO TABS: 650.0000 mg | ORAL_TABLET | ORAL | Status: DC | PRN

## 2020-05-08 MED ORDER — ZOLPIDEM TARTRATE 5 MG PO TABS: 5.0000 mg | ORAL_TABLET | Freq: Every evening | ORAL | Status: DC | PRN

## 2020-05-08 MED ORDER — PRENATAL MULTIVITAMIN CH
1.0000 | ORAL_TABLET | Freq: Every day | ORAL | Status: DC
  Administered 2020-05-08 – 2020-05-09 (×2): 1 via ORAL
  Filled 2020-05-08 (×2): qty 1

## 2020-05-08 MED ORDER — SENNOSIDES-DOCUSATE SODIUM 8.6-50 MG PO TABS
2.0000 | ORAL_TABLET | ORAL | Status: DC
  Administered 2020-05-08 (×2): 2 via ORAL
  Filled 2020-05-08 (×2): qty 2

## 2020-05-08 MED ORDER — DIBUCAINE (PERIANAL) 1 % EX OINT: 1.0000 "application " | TOPICAL_OINTMENT | CUTANEOUS | Status: DC | PRN

## 2020-05-08 MED ORDER — BENZOCAINE-MENTHOL 20-0.5 % EX AERO: 1.0000 "application " | INHALATION_SPRAY | CUTANEOUS | Status: DC | PRN

## 2020-05-08 MED ORDER — DIPHENHYDRAMINE HCL 25 MG PO CAPS: 25.0000 mg | ORAL_CAPSULE | Freq: Four times a day (QID) | ORAL | Status: DC | PRN

## 2020-05-08 MED ORDER — IBUPROFEN 600 MG PO TABS
600.0000 mg | ORAL_TABLET | Freq: Four times a day (QID) | ORAL | Status: DC
  Administered 2020-05-08 – 2020-05-09 (×6): 600 mg via ORAL
  Filled 2020-05-08 (×6): qty 1

## 2020-05-08 MED ORDER — ONDANSETRON HCL 4 MG PO TABS: 4.0000 mg | ORAL_TABLET | ORAL | Status: DC | PRN

## 2020-05-08 MED ORDER — COCONUT OIL OIL
1.0000 "application " | TOPICAL_OIL | Status: DC | PRN
  Administered 2020-05-08: 1 via TOPICAL

## 2020-05-08 MED ORDER — ONDANSETRON HCL 4 MG/2ML IJ SOLN: 4.0000 mg | INTRAMUSCULAR | Status: DC | PRN

## 2020-05-08 NOTE — Social Work (Signed)
MOB was referred for history of anxiety.   * Referral screened out by Clinical Social Worker because none of the following criteria appear to apply:  ~ History of anxiety/depression during this pregnancy, or of post-partum depression following prior delivery. ~ Diagnosis of anxiety and/or depression within last 3 years OR * MOB's symptoms currently being treated with medication and/or therapy.  Please contact the Clinical Social Worker if needs arise, by MOB request, or if MOB scores greater than 9/yes to question 10 on Edinburgh Postpartum Depression Screen.  Ghada Abbett, LCSWA Clinical Social Worker [facility]  

## 2020-05-08 NOTE — Lactation Note (Signed)
This note was copied from a baby's chart. Lactation Consultation Note  Patient Name: Girl Ronnell Makarewicz [id number] Date: 05/08/2020 Reason for consult: Initial assessment;Term P2, 24 hour term female infant, infant had light meconium -3% weight loss. Mom with hx: AMA, depression with anxiety  Per Mom, infant had 3 voids and 8 stools within 24 hours of life. Per mom, most feedings have been 14 to 20 minutes in length. Mom does have DEBP at home.  Per mom, she BF her 30 month old daughter for 14 months, BF was very painful with her at first and she had latch difficulties in the beginning. Per mom, she knows how to hand express she did it previously with her first child. Mom wants LC assess latch, she is having a little breast soreness when infant is feeding mom has coconut oil. LC did not observe any bruises or abrasions on mom's nipples at this time.  LC notice mom stroking infant's face and her hand position was not farther back enough infant had shallow latch.  Mom latched infant on her right breast using the cross cradle hold, LC ask mom bring her hand further back on breast, towards the chest wall, LC notice infant had swallows which could be heard with latch, per mom, no pain or discomfort with this latch, infant was still BF after 8 minutes when LC left the room.  Mom knows to BF infant according to cues, 8 to 12+ times within 24 hours and at 1 day infant will start cluster feeding.  Mom knows if she feel pain with latch, to break latch and re-latch infant at the breast. Mom knows to call RN or LC if she needs further assistance with latching infant at the breast.  Mom understands after latching infant at the breast she can hand express and give infant extra volume of EBM. Parents will continue to do lots of STS with infant. Mom made aware of O/P services, breastfeeding support groups, community resources, and our phone # for post-discharge questions.  Maternal Data Formula Feeding for  Exclusion: No Has patient been taught Hand Expression?: Yes Does the patient have breastfeeding experience prior to this delivery?: Yes  Feeding Feeding Type: Breast Fed  LATCH Score Latch: Grasps breast easily, tongue down, lips flanged, rhythmical sucking.  Audible Swallowing: Spontaneous and intermittent  Type of Nipple: Everted at rest and after stimulation  Comfort (Breast/Nipple): Soft / non-tender  Hold (Positioning): Assistance needed to correctly position infant at breast and maintain latch.  LATCH Score: 9  Interventions Interventions: Breast feeding basics reviewed;Breast compression;Assisted with latch;Skin to skin;Adjust position;Breast massage;Support pillows;Position options;Expressed milk  Lactation Tools Discussed/Used WIC Program: No   Consult Status Consult Status: Follow-up Date: 05/09/20 Follow-up type: In-patient    Danelle Earthly 05/08/2020, 10:34 PM

## 2020-05-08 NOTE — Progress Notes (Signed)
PPD # 1 S/P NSVD  Live born female  Birth Weight: 6 lb 2.1 oz (2780 g) APGAR: 8, 9  Newborn Delivery   Birth date/time: 05/07/2020 22:05:00 Delivery type: Vaginal, Spontaneous     Baby name: Mila Delivering provider: Carlean Jews C  Episiotomy:None   Lacerations:1st degree  Feeding: breast  Pain control at delivery: None   S:  Reports feeling well.             Tolerating po/ No nausea or vomiting             Bleeding is light             Pain controlled with ibuprofen (OTC)             Up ad lib / ambulatory / voiding without difficulties   O:  A & O x 3, in no apparent distress              VS:  Vitals:   05/08/20 0000 05/08/20 0025 05/08/20 0140 05/08/20 0547  BP:  119/71 112/65 124/74  Pulse:  64 66 (!) 55  Resp: 18 18 18 18   Temp:  (!) 97.5 F (36.4 C) 98.2 F (36.8 C) 98.4 F (36.9 C)  TempSrc:  Oral Oral Oral  SpO2:  98% 99% 99%  Weight:      Height:        LABS:  Recent Labs    05/07/20 1732 05/08/20 0556  WBC 10.6* 15.6*  HGB 13.8 11.8*  HCT 41.9 35.7*  PLT 246 226    Blood type: --/--/B POS (09/30 1732)  Rubella:     I&O: I/O last 3 completed shifts: In: -  Out: 66 [Blood:66]          No intake/output data recorded.  Vaccines: TDaP          UTD         Flu             UTD                    COVID-19 UTD  Gen: AAO x 3, NAD  Abdomen: soft, non-tender, non-distended             Fundus: firm, non-tender, U-1  Perineum: repair intact, no edema  Lochia: small  Extremities: no edema, no calf pain or tenderness    A/P: PPD # 1 39 y.o., 20   Principal Problem:   Postpartum care following vaginal delivery (9/30) Active Problems:   SVD (spontaneous vaginal delivery)   Normal labor   First degree perineal laceration   Doing well - stable status  Routine post partum orders  Anticipate discharge tomorrow    01-18-1985, MSN, CNM 05/08/2020, 10:09 AM

## 2020-05-09 MED ORDER — IBUPROFEN 600 MG PO TABS: 600.0000 mg | ORAL_TABLET | Freq: Four times a day (QID) | ORAL | 0 refills | Status: DC | PRN

## 2020-05-09 NOTE — Lactation Note (Signed)
This note was copied from a baby's chart. Lactation Consultation Note  Patient Name: Debbie Lloyd [id number] Date: 05/09/2020 Reason for consult: Follow-up assessment  P2 mother whose infant is now 2 hours old.  This is a term baby at 40+0 weeks.   Mother breast fed her first child for 14 months.  Mother had no questions/concerns when I arrived.  She will continue to feed 8-12 times/24 hours or sooner if baby shows feeding cues.  Mother's breasts are soft and non tender and nipples are everted and intact.  Engorgement prevention/treatment reviewed.  Manual pump provided with instructions for use.  Mother appreciative.  Mother has our OP phone number for questions after discharge.  Father present.   Maternal Data    Feeding Feeding Type: Breast Fed  LATCH Score                   Interventions    Lactation Tools Discussed/Used     Consult Status Consult Status: Complete Date: 05/09/20 Follow-up type: Call as needed    Cardelia Sassano R Dayan Desa 05/09/2020, 9:45 AM

## 2020-05-09 NOTE — Discharge Summary (Signed)
Postpartum Discharge Summary  Date of Service updated 05/09/20     Patient Name: Debbie Lloyd DOB: Nov 01, 1980 MRN: [id number]  Date of admission: 05/07/2020 Delivery date:05/07/2020  Delivering provider: Carlean Jews C  Date of discharge: 05/09/2020  Admitting diagnosis: Normal labor [O80, Z37.9] Intrauterine pregnancy: [redacted]w[redacted]d     Secondary diagnosis:  Principal Problem:   Postpartum care following vaginal delivery (9/30) Active Problems:   SVD (spontaneous vaginal delivery)   Normal labor   First degree perineal laceration  Additional problems: none    Discharge diagnosis: Term Pregnancy Delivered                                              Post partum procedures:routine Augmentation: N/A Complications: None  Hospital course: Onset of Labor With Vaginal Delivery      39 y.o. yo [id number] at [redacted]w[redacted]d was admitted in Active Labor on 05/07/2020. Patient had an uncomplicated labor course as follows:  Membrane Rupture Time/Date: 9:51 PM ,05/07/2020   Delivery Method:Vaginal, Spontaneous  Episiotomy: None  Lacerations:  1st degree  Patient had an uncomplicated postpartum course.  She is ambulating, tolerating a regular diet, passing flatus, and urinating well. Patient is discharged home in stable condition on 05/09/20.  Newborn Data: Birth date:05/07/2020  Birth time:10:05 PM  Gender:Female  Living status:Living  Apgars:8 ,9  Weight:2780 g   Magnesium Sulfate received: No BMZ received: No Rhophylac:N/A    Physical exam  Vitals:   05/08/20 0928 05/08/20 1323 05/08/20 2002 05/09/20 0623  BP: 109/71 (!) 113/59 124/77 (!) 105/59  Pulse: (!) 59 (!) 54 71 68  Resp: 18 20 15 18   Temp: 97.9 F (36.6 C) (!) 97.5 F (36.4 C) 98.3 F (36.8 C) 97.8 F (36.6 C)  TempSrc: Oral Oral Oral Oral  SpO2: 100% 100% 98% 99%  Weight:      Height:       General: alert Lochia: appropriate Uterine Fundus: firm Incision: N/A DVT Evaluation: No evidence of DVT seen on physical  exam. Labs: Lab Results  Component Value Date   WBC 15.6 (H) 05/08/2020   HGB 11.8 (L) 05/08/2020   HCT 35.7 (L) 05/08/2020   MCV 83.8 05/08/2020   PLT 226 05/08/2020   No flowsheet data found. Edinburgh Score: Edinburgh Postnatal Depression Scale Screening Tool 05/08/2020  I have been able to laugh and see the funny side of things. 0  I have looked forward with enjoyment to things. 0  I have blamed myself unnecessarily when things went wrong. 1  I have been anxious or worried for no good reason. 2  I have felt scared or panicky for no good reason. 0  Things have been getting on top of me. 0  I have been so unhappy that I have had difficulty sleeping. 0  I have felt sad or miserable. 0  I have been so unhappy that I have been crying. 0  The thought of harming myself has occurred to me. 0  Edinburgh Postnatal Depression Scale Total 3      After visit meds:  Allergies as of 05/09/2020   No Known Allergies     Medication List    TAKE these medications   acetaminophen 500 MG tablet Commonly known as: TYLENOL Take 1,000 mg by mouth every 6 (six) hours as needed for mild pain, fever or headache.   calcium  carbonate 500 MG chewable tablet Commonly known as: TUMS - dosed in mg elemental calcium Chew 500 mg by mouth 3 (three) times daily as needed for indigestion or heartburn.   famotidine 20 MG tablet Commonly known as: PEPCID Take 20 mg by mouth 2 (two) times daily.   ibuprofen 600 MG tablet Commonly known as: ADVIL Take 1 tablet (600 mg total) by mouth every 6 (six) hours as needed for cramping.   multivitamin-prenatal 27-0.8 MG Tabs tablet Take 1 tablet by mouth every evening.            Discharge Care Instructions  (From admission, onward)         Start     Ordered   05/09/20 0000  Discharge wound care:       Comments: Sitz baths 2 times /day with warm water x 1 week. May add herbals: 1 ounce dried comfrey leaf* 1 ounce calendula flowers 1 ounce  lavender flowers  Supplies can be found online at [employer] sources at [employer], [employer]  1/2 ounce dried uva ursi leaves 1/2 ounce witch hazel blossoms (if you can find them) 1/2 ounce dried sage leaf 1/2 cup sea salt Directions: Bring 2 quarts of water to a boil. Turn off heat, and place 1 ounce (approximately 1 large handful) of the above mixed herbs (not the salt) into the pot. Steep, covered, for 30 minutes.  Strain the liquid well with a fine mesh strainer, and discard the herb material. Add 2 quarts of liquid to the tub, along with the 1/2 cup of salt. This medicinal liquid can also be made into compresses and peri-rinses.   05/09/20 1036           Discharge home in stable condition Infant Feeding: Breast Infant Disposition:home with mother Discharge instruction: per After Visit Summary and Postpartum booklet. Activity: Advance as tolerated. Pelvic rest for 6 weeks.  Diet: routine diet Anticipated Birth Control: Unsure Postpartum Appointment:6 weeks Additional Postpartum F/U: n/a Future Appointments:No future appointments. Follow up Visit:      05/09/2020 Debbie Lloyd A [name], DO

## 2020-05-09 NOTE — Discharge Instructions (Signed)
Postpartum Care After Vaginal Delivery This sheet gives you information about how to care for yourself from the time you deliver your baby to up to 6-12 weeks after delivery (postpartum period). Your health care provider may also give you more specific instructions. If you have problems or questions, contact your health care provider. Follow these instructions at home: Vaginal bleeding  It is normal to have vaginal bleeding (lochia) after delivery. Wear a sanitary pad for vaginal bleeding and discharge. ? During the first week after delivery, the amount and appearance of lochia is often similar to a menstrual period. ? Over the next few weeks, it will gradually decrease to a dry, yellow-brown discharge. ? For most women, lochia stops completely by 4-6 weeks after delivery. Vaginal bleeding can vary from woman to woman.  Change your sanitary pads frequently. Watch for any changes in your flow, such as: ? A sudden increase in volume. ? A change in color. ? Large blood clots.  If you pass a blood clot from your vagina, save it and call your health care provider to discuss. Do not flush blood clots down the toilet before talking with your health care provider.  Do not use tampons or douches until your health care provider says this is safe.  If you are not breastfeeding, your period should return 6-8 weeks after delivery. If you are feeding your child breast milk only (exclusive breastfeeding), your period may not return until you stop breastfeeding. Perineal care  Keep the area between the vagina and the anus (perineum) clean and dry as told by your health care provider. Use medicated pads and pain-relieving sprays and creams as directed.  If you had a cut in the perineum (episiotomy) or a tear in the vagina, check the area for signs of infection until you are healed. Check for: ? More redness, swelling, or pain. ? Fluid or blood coming from the cut or tear. ? Warmth. ? Pus or a bad  smell.  You may be given a squirt bottle to use instead of wiping to clean the perineum area after you go to the bathroom. As you start healing, you may use the squirt bottle before wiping yourself. Make sure to wipe gently.  To relieve pain caused by an episiotomy, a tear in the vagina, or swollen veins in the anus (hemorrhoids), try taking a warm sitz bath 2-3 times a day. A sitz bath is a warm water bath that is taken while you are sitting down. The water should only come up to your hips and should cover your buttocks. Breast care  Within the first few days after delivery, your breasts may feel heavy, full, and uncomfortable (breast engorgement). Milk may also leak from your breasts. Your health care provider can suggest ways to help relieve the discomfort. Breast engorgement should go away within a few days.  If you are breastfeeding: ? Wear a bra that supports your breasts and fits you well. ? Keep your nipples clean and dry. Apply creams and ointments as told by your health care provider. ? You may need to use breast pads to absorb milk that leaks from your breasts. ? You may have uterine contractions every time you breastfeed for up to several weeks after delivery. Uterine contractions help your uterus return to its normal size. ? If you have any problems with breastfeeding, work with your health care provider or Advertising copywriter.  If you are not breastfeeding: ? Avoid touching your breasts a lot. Doing this can  make your breasts produce more milk. ? Wear a good-fitting bra and use cold packs to help with swelling. ? Do not squeeze out (express) milk. This causes you to make more milk. Intimacy and sexuality  Ask your health care provider when you can engage in sexual activity. This may depend on: ? Your risk of infection. ? How fast you are healing. ? Your comfort and desire to engage in sexual activity.  You are able to get pregnant after delivery, even if you have not had  your period. If desired, talk with your health care provider about methods of birth control (contraception). Medicines  Take over-the-counter and prescription medicines only as told by your health care provider.  If you were prescribed an antibiotic medicine, take it as told by your health care provider. Do not stop taking the antibiotic even if you start to feel better. Activity  Gradually return to your normal activities as told by your health care provider. Ask your health care provider what activities are safe for you.  Rest as much as possible. Try to rest or take a nap while your baby is sleeping. Eating and drinking   Drink enough fluid to keep your urine pale yellow.  Eat high-fiber foods every day. These may help prevent or relieve constipation. High-fiber foods include: ? Whole grain cereals and breads. ? Brown rice. ? Beans. ? Fresh fruits and vegetables.  Do not try to lose weight quickly by cutting back on calories.  Take your prenatal vitamins until your postpartum checkup or until your health care provider tells you it is okay to stop. Lifestyle  Do not use any products that contain nicotine or tobacco, such as cigarettes and e-cigarettes. If you need help quitting, ask your health care provider.  Do not drink alcohol, especially if you are breastfeeding. General instructions  Keep all follow-up visits for you and your baby as told by your health care provider. Most women visit their health care provider for a postpartum checkup within the first 3-6 weeks after delivery. Contact a health care provider if:  You feel unable to cope with the changes that your child brings to your life, and these feelings do not go away.  You feel unusually sad or worried.  Your breasts become red, painful, or hard.  You have a fever.  You have trouble holding urine or keeping urine from leaking.  You have little or no interest in activities you used to enjoy.  You have not  breastfed at all and you have not had a menstrual period for 12 weeks after delivery.  You have stopped breastfeeding and you have not had a menstrual period for 12 weeks after you stopped breastfeeding.  You have questions about caring for yourself or your baby.  You pass a blood clot from your vagina. Get help right away if:  You have chest pain.  You have difficulty breathing.  You have sudden, severe leg pain.  You have severe pain or cramping in your lower abdomen.  You bleed from your vagina so much that you fill more than one sanitary pad in one hour. Bleeding should not be heavier than your heaviest period.  You develop a severe headache.  You faint.  You have blurred vision or spots in your vision.  You have bad-smelling vaginal discharge.  You have thoughts about hurting yourself or your baby. If you ever feel like you may hurt yourself or others, or have thoughts about taking your own life, get  help right away. You can go to the nearest emergency department or call:  Your local emergency services (911 in the [place]  A suicide crisis helpline, such as the National Suicide Prevention Lifeline at 613-411-5328. This is open 24 hours a day. Summary  The period of time right after you deliver your newborn up to 6-12 weeks after delivery is called the postpartum period.  Gradually return to your normal activities as told by your health care provider.  Keep all follow-up visits for you and your baby as told by your health care provider. This information is not intended to replace advice given to you by your health care provider. Make sure you discuss any questions you have with your health care provider. Document Revised: 07/28/2017 Document Reviewed: 05/08/2017 Elsevier Patient Education  2020 [employer]. Lactation outpatient support - home visit  Linnell Fulling RN, [address], [employer] at [employer]: Lactation  Consultant  [email or web address]    Additional resources:  [facility] https://[email or web address]/information-[email or web address]   Chiropractic specialist   Dr. Iva Boop [email or web address]  Craniosacral therapy for baby  Kathryne Eriksson  [email or web address]

## 2020-06-01 DIAGNOSIS — L03019 Cellulitis of unspecified finger: Secondary | ICD-10-CM | POA: Diagnosis not present

## 2020-06-24 DIAGNOSIS — Z124 Encounter for screening for malignant neoplasm of cervix: Secondary | ICD-10-CM | POA: Diagnosis not present

## 2020-08-29 ENCOUNTER — Ambulatory Visit (HOSPITAL_COMMUNITY)
Admission: EM | Admit: 2020-08-29 | Discharge: 2020-08-29 | Disposition: A | Discharge status: Home / Self Care | Payer: BC Managed Care – PPO | Attending: Internal Medicine | Admitting: Internal Medicine

## 2020-08-29 ENCOUNTER — Encounter (HOSPITAL_COMMUNITY): Payer: Self-pay

## 2020-08-29 ENCOUNTER — Other Ambulatory Visit: Payer: Self-pay

## 2020-08-29 DIAGNOSIS — L249 Irritant contact dermatitis, unspecified cause: Secondary | ICD-10-CM | POA: Diagnosis not present

## 2020-08-29 NOTE — ED Triage Notes (Signed)
Pt presents with unusual bilateral breast tenderness  With no other complaints X 2 days; pt states she has a 55 month old and pumps daily.

## 2020-08-29 NOTE — ED Provider Notes (Addendum)
[facility]    CSN: [id number] Arrival date & time: 08/29/20  1335      History   Chief Complaint Chief Complaint  Patient presents with  . Breast Pain    HPI Debbie Lloyd is a 40 y.o. female comes to urgent care with complaints of burning sensation around both nipples which started a couple of days ago.  Patient has a 77-month-old baby at home.  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  She endorses burning sensation and some pain around the nipples.  No redness, rash or swelling over the nipples or the areolar area.  No fever or chills.  Breastmilk volume has not decreased.  Otherwise no trauma to the nipples or areola area.  No itching.  No change in the bra and/or use of any new cosmetics or strong soap and cleaning the nipple area.   HPI  Past Medical History:  Diagnosis Date  . Anemia   . Anxiety   . GERD (gastroesophageal reflux disease)   . Idiopathic hirsutism   . Vaginal Pap smear, abnormal     Patient Active Problem List   Diagnosis Date Noted  . Normal labor 05/07/2020  . First degree perineal laceration 05/07/2020  . SVD (spontaneous vaginal delivery) 05/29/2018  . Encounter for planned induction of labor 05/28/2018  . Postpartum care following vaginal delivery (9/30) 05/28/2018    Past Surgical History:  Procedure Laterality Date  . COLONOSCOPY  2008  . COLPOSCOPY    . DILATATION & CURETTAGE/HYSTEROSCOPY WITH MYOSURE N/A 04/17/2017   Procedure: DILATATION & CURETTAGE/HYSTEROSCOPY WITH MYOSURE;  Surgeon: Maxie Better, MD;  Location: [facility];  Service: Gynecology;  Laterality: N/A;  . HEMORROIDECTOMY  02/2015    OB History    Gravida  3   Para  2   Term  2   Preterm      AB  1   Living  2     SAB  1   IAB      Ectopic      Multiple  0   Live Births  2            Home Medications    Prior to Admission medications   Medication Sig Start Date End Date Taking? Authorizing Provider   acetaminophen (TYLENOL) 500 MG tablet Take 1,000 mg by mouth every 6 (six) hours as needed for mild pain, fever or headache.    [provider]  calcium carbonate (TUMS - DOSED IN MG ELEMENTAL CALCIUM) 500 MG chewable tablet Chew 500 mg by mouth 3 (three) times daily as needed for indigestion or heartburn.     [provider]  famotidine (PEPCID) 20 MG tablet Take 20 mg by mouth 2 (two) times daily.    [provider]  ibuprofen (ADVIL) 600 MG tablet Take 1 tablet (600 mg total) by mouth every 6 (six) hours as needed for cramping. 05/09/20   Law, Cassandra A, DO  Prenatal Vit-Fe Fumarate-FA (MULTIVITAMIN-PRENATAL) 27-0.8 MG TABS tablet Take 1 tablet by mouth every evening.     [provider]    Family History Family History  Problem Relation Age of Onset  . Diabetes Father   . Diabetes Maternal Grandmother   . Hypertension Maternal Grandmother   . Stroke Maternal Grandfather     Social History Social History   Tobacco Use  . Smoking status: Never Smoker  . Smokeless tobacco: Never Used  Vaping Use  . Vaping Use: Never used  Substance  Use Topics  . Alcohol use: Not Currently    Alcohol/week: 4.0 standard drinks    Types: 2 Glasses of wine, 2 Shots of liquor per week  . Drug use: No     Allergies   Patient has no known allergies.   Review of Systems Review of Systems  Respiratory: Negative.   Genitourinary: Negative.   Neurological: Negative.      Physical Exam Triage Vital Signs ED Triage Vitals  Enc Vitals Group     BP 08/29/20 1421 114/80     Pulse Rate 08/29/20 1421 62     Resp 08/29/20 1421 17     Temp 08/29/20 1421 98.2 F (36.8 C)     Temp Source 08/29/20 1421 Oral     SpO2 08/29/20 1421 99 %     Weight --      Height --      Head Circumference --      Peak Flow --      Pain Score 08/29/20 1420 4     Pain Loc --      Pain Edu? --      Excl. in GC? --    No data found.  Updated Vital Signs BP 114/80 (BP  Location: Right Arm)   Pulse 62   Temp 98.2 F (36.8 C) (Oral)   Resp 17   SpO2 99%   Visual Acuity Right Eye Distance:   Left Eye Distance:   Bilateral Distance:    Right Eye Near:   Left Eye Near:    Bilateral Near:     Physical Exam Vitals and nursing note reviewed. Exam conducted with a chaperone present.  Constitutional:      General: She is not in acute distress.    Appearance: She is not ill-appearing.  Cardiovascular:     Rate and Rhythm: Normal rate and regular rhythm.     Comments: Both nipples and areola area are normal.  There is no rash or erythema.  No tenderness or masses on palpation. Neurological:     Mental Status: She is alert.      UC Treatments / Results  Labs (all labs ordered are listed, but only abnormal results are displayed) Labs Reviewed - No data to display  EKG   Radiology No results found.  Procedures Procedures (including critical care time)  Medications Ordered in UC Medications - No data to display  Initial Impression / Assessment and Plan / UC Course  I have reviewed the triage vital signs and the nursing notes.  Pertinent labs & imaging results that were available during my care of the patient were reviewed by me and considered in my medical decision making (see chart for details).     1.  Irritant dermatitis in the nipples and areolar area: Patient is advised to use warm damp towel in cleaning the areolar area before and after pumping Application of Aquaphor in the nipple and areolar area recommended If patient notices any signs of infection she is advised to return to the urgent care immediately. Final Clinical Impressions(s) / UC Diagnoses   Final diagnoses:  Irritant contact dermatitis, unspecified trigger     Discharge Instructions     Please use with clean towel to clean around the nipples before and after pumping Apply Aquaphor around the nipples and areola to prevent excessive dryness If you notice  redness, worsening pain, decrease in volume of milk pumped please return to urgent care to be reevaluated.   ED Prescriptions  None     PDMP not reviewed this encounter.   Merrilee Jansky, MD 08/29/20 1531    Merrilee Jansky, MD 08/29/20 [phone]

## 2020-08-29 NOTE — Discharge Instructions (Signed)
Please use with clean towel to clean around the nipples before and after pumping Apply Aquaphor around the nipples and areola to prevent excessive dryness If you notice redness, worsening pain, decrease in volume of milk pumped please return to urgent care to be reevaluated.

## 2020-08-30 ENCOUNTER — Ambulatory Visit (HOSPITAL_COMMUNITY): Payer: Self-pay

## 2020-11-14 ENCOUNTER — Emergency Department (INDEPENDENT_AMBULATORY_CARE_PROVIDER_SITE_OTHER)
Admission: RE | Admit: 2020-11-14 | Discharge: 2020-11-14 | Disposition: A | Discharge status: Home / Self Care | Payer: BC Managed Care – PPO | Source: Ambulatory Visit

## 2020-11-14 ENCOUNTER — Other Ambulatory Visit: Payer: Self-pay

## 2020-11-14 VITALS — BP 113/76 | HR 62 | Temp 98.2°F | Resp 18 | Ht 62.5 in | Wt 126.0 lb

## 2020-11-14 DIAGNOSIS — H01135 Eczematous dermatitis of left lower eyelid: Secondary | ICD-10-CM | POA: Diagnosis not present

## 2020-11-14 DIAGNOSIS — H01134 Eczematous dermatitis of left upper eyelid: Secondary | ICD-10-CM | POA: Diagnosis not present

## 2020-11-14 DIAGNOSIS — H01132 Eczematous dermatitis of right lower eyelid: Secondary | ICD-10-CM | POA: Diagnosis not present

## 2020-11-14 DIAGNOSIS — H01131 Eczematous dermatitis of right upper eyelid: Secondary | ICD-10-CM | POA: Diagnosis not present

## 2020-11-14 MED ORDER — PREDNISONE 20 MG PO TABS: ORAL_TABLET | ORAL | 0 refills | Status: AC

## 2020-11-14 NOTE — ED Provider Notes (Signed)
[facility]    CSN: [id number] Arrival date & time: 11/14/20  1130      History   Chief Complaint Chief Complaint  Patient presents with  . Rash    Pt states that she has a rash around both of her eyes. X10 days    HPI Debbie Lloyd is a 40 y.o. female.   HPI   Patient has a rash around her eyes.  Is been present for 10 days.  She denies the use of any new cosmetics or products on her face.  She has a 21-month-old baby.  She is breast-feeding.  She has been using 1% hydrocortisone sparingly on the rash.  She has been taking Zyrtec 1 a day thinking this was an allergic reaction of some sort.  The rash is not improving.  It does not itch or sting.  It is not painful.  It is not particularly spreading, however, it is not going away Patient does not have any known allergies, states she has not been using any new products.  Past Medical History:  Diagnosis Date  . Anemia   . Anxiety   . GERD (gastroesophageal reflux disease)   . Idiopathic hirsutism   . Vaginal Pap smear, abnormal     Patient Active Problem List   Diagnosis Date Noted  . Normal labor 05/07/2020  . First degree perineal laceration 05/07/2020  . SVD (spontaneous vaginal delivery) 05/29/2018  . Encounter for planned induction of labor 05/28/2018  . Postpartum care following vaginal delivery (9/30) 05/28/2018    Past Surgical History:  Procedure Laterality Date  . COLONOSCOPY  2008  . COLPOSCOPY    . DILATATION & CURETTAGE/HYSTEROSCOPY WITH MYOSURE N/A 04/17/2017   Procedure: DILATATION & CURETTAGE/HYSTEROSCOPY WITH MYOSURE;  Surgeon: Maxie Better, MD;  Location: [facility];  Service: Gynecology;  Laterality: N/A;  . HEMORROIDECTOMY  02/2015    OB History    Gravida  3   Para  2   Term  2   Preterm      AB  1   Living  2     SAB  1   IAB      Ectopic      Multiple  0   Live Births  2            Home Medications    Prior to Admission medications    Medication Sig Start Date End Date Taking? Authorizing Provider  norethindrone (MICRONOR) 0.35 MG tablet Take 1 tablet by mouth daily. 09/29/20  Yes [provider]  predniSONE (DELTASONE) 20 MG tablet Take 1 pill twice a day with food for 5 days.  After this take 1 a day until gone 11/14/20  Yes Eustace Moore, MD  Prenatal Vit-Fe Fumarate-FA (MULTIVITAMIN-PRENATAL) 27-0.8 MG TABS tablet Take 1 tablet by mouth every evening.    Yes [provider]  famotidine (PEPCID) 20 MG tablet Take 20 mg by mouth 2 (two) times daily.  11/14/20  [provider]    Family History Family History  Problem Relation Age of Onset  . Diabetes Father   . Diabetes Maternal Grandmother   . Hypertension Maternal Grandmother   . Stroke Maternal Grandfather     Social History Social History   Tobacco Use  . Smoking status: Never Smoker  . Smokeless tobacco: Never Used  Vaping Use  . Vaping Use: Never used  Substance Use Topics  . Alcohol use: Not Currently    Alcohol/week: 4.0 standard drinks  Types: 2 Glasses of wine, 2 Shots of liquor per week  . Drug use: No     Allergies   Patient has no known allergies.   Review of Systems Review of Systems See HPI  Physical Exam Triage Vital Signs ED Triage Vitals  Enc Vitals Group     BP 11/14/20 1151 113/76     Pulse Rate 11/14/20 1151 62     Resp 11/14/20 1151 18     Temp 11/14/20 1151 98.2 F (36.8 C)     Temp Source 11/14/20 1151 Oral     SpO2 11/14/20 1151 98 %     Weight 11/14/20 1148 126 lb (57.2 kg)     Height 11/14/20 1148 5' 2.5" (1.588 m)     Head Circumference --      Peak Flow --      Pain Score 11/14/20 1148 0     Pain Loc --      Pain Edu? --      Excl. in GC? --    No data found.  Updated Vital Signs BP 113/76 (BP Location: Right Arm)   Pulse 62   Temp 98.2 F (36.8 C) (Oral)   Resp 18   Ht 5' 2.5" (1.588 m)   Wt 57.2 kg   SpO2 98%   Breastfeeding Yes   BMI 22.68 kg/m      Physical  Exam Constitutional:      General: She is not in acute distress.    Appearance: She is well-developed.  HENT:     Head: Normocephalic and atraumatic.     Mouth/Throat:     Comments: Mask is in place Eyes:     Conjunctiva/sclera: Conjunctivae normal.     Pupils: Pupils are equal, round, and reactive to light.   Cardiovascular:     Rate and Rhythm: Normal rate.  Pulmonary:     Effort: Pulmonary effort is normal. No respiratory distress.  Abdominal:     General: There is no distension.     Palpations: Abdomen is soft.  Musculoskeletal:        General: Normal range of motion.     Cervical back: Normal range of motion.  Skin:    General: Skin is warm and dry.  Neurological:     Mental Status: She is alert.      UC Treatments / Results  Labs (all labs ordered are listed, but only abnormal results are displayed) Labs Reviewed - No data to display  EKG   Radiology No results found.  Procedures Procedures (including critical care time)  Medications Ordered in UC Medications - No data to display  Initial Impression / Assessment and Plan / UC Course  I have reviewed the triage vital signs and the nursing notes.  Pertinent labs & imaging results that were available during my care of the patient were reviewed by me and considered in my medical decision making (see chart for details).     Patient has a follow-up with dermatology in May.  I have encouraged her to keep this.  I encouraged her to photograph her rash so that they can see what her concerns were at the time she made the appointment. Final Clinical Impressions(s) / UC Diagnoses   Final diagnoses:  Eczematous dermatitis of upper and lower eyelids of both eyes     Discharge Instructions     Use cool compresses to eye area May use Vaseline for moisture Take prednisone as directed  See dermatology in May  ED Prescriptions    Medication Sig Dispense Auth. Provider   predniSONE (DELTASONE) 20 MG tablet  Take 1 pill twice a day with food for 5 days.  After this take 1 a day until gone 15 tablet [place] See Letta Pate, MD     PDMP not reviewed this encounter.   Eustace Moore, MD 11/14/20 1218

## 2020-11-14 NOTE — Discharge Instructions (Addendum)
Use cool compresses to eye area May use Vaseline for moisture Take prednisone as directed  See dermatology in May

## 2020-11-14 NOTE — ED Triage Notes (Signed)
Pt states that she has a rash around both eyes. x10 days
